# Patient Record
Sex: Male | Born: 1935 | Race: Black or African American | Hispanic: No | Marital: Married | Smoking: Former smoker
Health system: Southern US, Community
[De-identification: ages and names within clinical notes are randomized; demographics above are authoritative.]

## PROBLEM LIST (undated history)

## (undated) DIAGNOSIS — K219 Gastro-esophageal reflux disease without esophagitis: Secondary | ICD-10-CM

## (undated) DIAGNOSIS — R319 Hematuria, unspecified: Secondary | ICD-10-CM

## (undated) DIAGNOSIS — I10 Essential (primary) hypertension: Secondary | ICD-10-CM

## (undated) DIAGNOSIS — M1990 Unspecified osteoarthritis, unspecified site: Secondary | ICD-10-CM

## (undated) DIAGNOSIS — R011 Cardiac murmur, unspecified: Secondary | ICD-10-CM

---

## 2015-06-09 DIAGNOSIS — R319 Hematuria, unspecified: Secondary | ICD-10-CM

## 2015-06-09 HISTORY — PX: CYSTOSCOPY: SUR368

## 2015-06-09 HISTORY — DX: Hematuria, unspecified: R31.9

## 2015-07-02 ENCOUNTER — Emergency Department (HOSPITAL_COMMUNITY)
Admission: EM | Admit: 2015-07-02 | Discharge: 2015-07-02 | Disposition: A | Discharge status: Home / Self Care | Payer: Medicare Other | Attending: Emergency Medicine | Admitting: Emergency Medicine

## 2015-07-02 DIAGNOSIS — J0100 Acute maxillary sinusitis, unspecified: Secondary | ICD-10-CM | POA: Diagnosis not present

## 2015-07-02 DIAGNOSIS — R0981 Nasal congestion: Secondary | ICD-10-CM | POA: Diagnosis present

## 2015-07-02 NOTE — ED Provider Notes (Signed)
CSN: [id number]     Arrival date & time 07/02/15  1524 History   None    Chief Complaint  Patient presents with  . Nasal Congestion     (Consider location/radiation/quality/duration/timing/severity/associated sxs/prior Treatment) HPI Comments: Patient presents for evaluation of worsening sinus congestion and throat congestion. Patient has been sick for nearly a week. Patient was seen at an urgent care and started on amoxicillin, but did not improve after 2 days. He saw his doctor who stopped the amoxicillin and started him on Mucinex. Patient still not improving. There is no shortness of breath. He has not had any fever. Patient reports persistent sinus congestion, ear popping, facial discomfort.    No past medical history on file. No past surgical history on file. No family history on file. Social History  Substance Use Topics  . Smoking status: Not on file  . Smokeless tobacco: Not on file  . Alcohol Use: Not on file    Review of Systems  Constitutional: Negative for fever.  HENT: Positive for congestion.   All other systems reviewed and are negative.     Allergies  Review of patient's allergies indicates not on file.  Home Medications   Prior to Admission medications   Not on File   There were no vitals taken for this visit. Physical Exam  Constitutional: He is oriented to person, place, and time. He appears well-developed and well-nourished. No distress.  HENT:  Head: Normocephalic and atraumatic.  Right Ear: Hearing normal.  Left Ear: Hearing normal.  Nose: Mucosal edema present. Right sinus exhibits maxillary sinus tenderness. Left sinus exhibits maxillary sinus tenderness.  Mouth/Throat: Oropharynx is clear and moist and mucous membranes are normal.  Eyes: Conjunctivae and EOM are normal. Pupils are equal, round, and reactive to light.  Neck: Normal range of motion. Neck supple.  Cardiovascular: Regular rhythm, S1 normal and S2 normal.  Exam reveals no gallop  and no friction rub.   No murmur heard. Pulmonary/Chest: Effort normal and breath sounds normal. No respiratory distress. He exhibits no tenderness.  Abdominal: Soft. Normal appearance and bowel sounds are normal. There is no hepatosplenomegaly. There is no tenderness. There is no rebound, no guarding, no tenderness at McBurney's point and negative Murphy's sign. No hernia.  Musculoskeletal: Normal range of motion.  Neurological: He is alert and oriented to person, place, and time. He has normal strength. No cranial nerve deficit or sensory deficit. Coordination normal. GCS eye subscore is 4. GCS verbal subscore is 5. GCS motor subscore is 6.  Skin: Skin is warm, dry and intact. No rash noted. No cyanosis.  Psychiatric: He has a normal mood and affect. His speech is normal and behavior is normal. Thought content normal.  Nursing note and vitals reviewed.   ED Course  Procedures (including critical care time) Labs Review Labs Reviewed - No data to display  Imaging Review No results found. I have personally reviewed and evaluated these images and lab results as part of my medical decision-making.   EKG Interpretation None      MDM   Final diagnoses:  Acute maxillary sinusitis, recurrence not specified    Patient seen and examined for sinus congestion and URI type symptoms. Symptoms ongoing for nearly a week. Patient has multiple comorbidities. Patient does have signs and symptoms of sinusitis, will treat with Biaxin and prednisone.    Gilda Crease, MD 07/02/15 [phone]

## 2015-07-02 NOTE — ED Notes (Signed)
SEE DOWNTIME FORMS 

## 2015-07-02 NOTE — ED Notes (Signed)
Pt reports sinus congestion for extended amount of time, has been to pcp and given meds but no relief. Reports having nasal congestion and feels mucus running down the back of his throat. Airway intact and no distress noted.

## 2015-07-07 ENCOUNTER — Inpatient Hospital Stay (HOSPITAL_COMMUNITY)
Admission: EM | Admit: 2015-07-07 | Discharge: 2015-07-10 | DRG: 644 | Disposition: A | Discharge status: Home / Self Care | Payer: Medicare Other | Attending: Internal Medicine | Admitting: Internal Medicine

## 2015-07-07 ENCOUNTER — Emergency Department (HOSPITAL_COMMUNITY): Payer: Medicare Other

## 2015-07-07 DIAGNOSIS — K219 Gastro-esophageal reflux disease without esophagitis: Secondary | ICD-10-CM | POA: Diagnosis present

## 2015-07-07 DIAGNOSIS — B9689 Other specified bacterial agents as the cause of diseases classified elsewhere: Secondary | ICD-10-CM | POA: Diagnosis not present

## 2015-07-07 DIAGNOSIS — E222 Syndrome of inappropriate secretion of antidiuretic hormone: Secondary | ICD-10-CM | POA: Diagnosis present

## 2015-07-07 DIAGNOSIS — K121 Other forms of stomatitis: Secondary | ICD-10-CM | POA: Diagnosis present

## 2015-07-07 DIAGNOSIS — N342 Other urethritis: Secondary | ICD-10-CM

## 2015-07-07 DIAGNOSIS — N390 Urinary tract infection, site not specified: Secondary | ICD-10-CM | POA: Diagnosis not present

## 2015-07-07 DIAGNOSIS — E871 Hypo-osmolality and hyponatremia: Secondary | ICD-10-CM | POA: Diagnosis present

## 2015-07-07 DIAGNOSIS — N179 Acute kidney failure, unspecified: Secondary | ICD-10-CM

## 2015-07-07 DIAGNOSIS — E785 Hyperlipidemia, unspecified: Secondary | ICD-10-CM | POA: Diagnosis present

## 2015-07-07 DIAGNOSIS — I10 Essential (primary) hypertension: Secondary | ICD-10-CM | POA: Diagnosis present

## 2015-07-07 DIAGNOSIS — Z7982 Long term (current) use of aspirin: Secondary | ICD-10-CM | POA: Diagnosis not present

## 2015-07-07 DIAGNOSIS — Z9889 Other specified postprocedural states: Secondary | ICD-10-CM | POA: Diagnosis not present

## 2015-07-07 DIAGNOSIS — R269 Unspecified abnormalities of gait and mobility: Secondary | ICD-10-CM

## 2015-07-07 HISTORY — DX: Gastro-esophageal reflux disease without esophagitis: K21.9

## 2015-07-07 HISTORY — DX: Essential (primary) hypertension: I10

## 2015-07-07 HISTORY — DX: Cardiac murmur, unspecified: R01.1

## 2015-07-07 HISTORY — DX: Unspecified osteoarthritis, unspecified site: M19.90

## 2015-07-07 HISTORY — DX: Hematuria, unspecified: R31.9

## 2015-07-07 LAB — URINALYSIS, ROUTINE W REFLEX MICROSCOPIC
BILIRUBIN URINE: NEGATIVE
GLUCOSE, UA: NEGATIVE mg/dL
Ketones, ur: NEGATIVE mg/dL
Nitrite: POSITIVE — AB
PROTEIN: NEGATIVE mg/dL
SPECIFIC GRAVITY, URINE: 1.013 (ref 1.005–1.030)
UROBILINOGEN UA: 0.2 mg/dL (ref 0.0–1.0)
pH: 6 (ref 5.0–8.0)

## 2015-07-07 LAB — CBC WITH DIFFERENTIAL/PLATELET
Basophils Absolute: 0 10*3/uL (ref 0.0–0.1)
Basophils Relative: 0 % (ref 0–1)
EOS ABS: 0 10*3/uL (ref 0.0–0.7)
EOS PCT: 0 % (ref 0–5)
HCT: 42.3 % (ref 39.0–52.0)
Hemoglobin: 15.6 g/dL (ref 13.0–17.0)
LYMPHS ABS: 0.4 10*3/uL — AB (ref 0.7–4.0)
LYMPHS PCT: 3 % — AB (ref 12–46)
MCH: 31.5 pg (ref 26.0–34.0)
MCHC: 36.9 g/dL — ABNORMAL HIGH (ref 30.0–36.0)
MCV: 85.5 fL (ref 78.0–100.0)
MONO ABS: 1 10*3/uL (ref 0.1–1.0)
Monocytes Relative: 7 % (ref 3–12)
Neutro Abs: 12.5 10*3/uL — ABNORMAL HIGH (ref 1.7–7.7)
Neutrophils Relative %: 90 % — ABNORMAL HIGH (ref 43–77)
PLATELETS: 317 10*3/uL (ref 150–400)
RBC: 4.95 MIL/uL (ref 4.22–5.81)
RDW: 12.4 % (ref 11.5–15.5)
WBC: 12.6 10*3/uL — AB (ref 4.0–10.5)

## 2015-07-07 LAB — TROPONIN I

## 2015-07-07 LAB — BASIC METABOLIC PANEL
ANION GAP: 9 (ref 5–15)
Anion gap: 13 (ref 5–15)
BUN: 15 mg/dL (ref 6–20)
BUN: 18 mg/dL (ref 6–20)
CALCIUM: 9.4 mg/dL (ref 8.9–10.3)
CALCIUM: 8.5 mg/dL — AB (ref 8.9–10.3)
CO2: 26 mmol/L (ref 22–32)
CO2: 26 mmol/L (ref 22–32)
CREATININE: 1.17 mg/dL (ref 0.61–1.24)
CREATININE: 1.23 mg/dL (ref 0.61–1.24)
Chloride: 79 mmol/L — ABNORMAL LOW (ref 101–111)
Chloride: 83 mmol/L — ABNORMAL LOW (ref 101–111)
GFR calc Af Amer: >60 mL/min (ref >60)
GFR calc Af Amer: >60 mL/min (ref >60)
GFR, EST NON AFRICAN AMERICAN: 58 mL/min — AB (ref >60)
GFR, EST NON AFRICAN AMERICAN: 55 mL/min — AB (ref >60)
GLUCOSE: 95 mg/dL (ref 65–99)
Glucose, Bld: 123 mg/dL — ABNORMAL HIGH (ref 65–99)
Potassium: 3.5 mmol/L (ref 3.5–5.1)
Potassium: 3.8 mmol/L (ref 3.5–5.1)
Sodium: 118 mmol/L — CL (ref 135–145)
Sodium: 118 mmol/L — CL (ref 135–145)

## 2015-07-07 LAB — COMPREHENSIVE METABOLIC PANEL
ALT: 38 U/L (ref 17–63)
ANION GAP: 12 (ref 5–15)
AST: 40 U/L (ref 15–41)
Albumin: 3.7 g/dL (ref 3.5–5.0)
Alkaline Phosphatase: 61 U/L (ref 38–126)
BUN: 19 mg/dL (ref 6–20)
CHLORIDE: 80 mmol/L — AB (ref 101–111)
CO2: 24 mmol/L (ref 22–32)
Calcium: 9.2 mg/dL (ref 8.9–10.3)
Creatinine, Ser: 1.26 mg/dL — ABNORMAL HIGH (ref 0.61–1.24)
GFR, EST NON AFRICAN AMERICAN: 53 mL/min — AB (ref >60)
Glucose, Bld: 109 mg/dL — ABNORMAL HIGH (ref 65–99)
POTASSIUM: 3.5 mmol/L (ref 3.5–5.1)
SODIUM: 116 mmol/L — AB (ref 135–145)
Total Bilirubin: 1 mg/dL (ref 0.3–1.2)
Total Protein: 7.2 g/dL (ref 6.5–8.1)

## 2015-07-07 LAB — BRAIN NATRIURETIC PEPTIDE: B NATRIURETIC PEPTIDE 5: 33.6 pg/mL (ref 0.0–100.0)

## 2015-07-07 LAB — TSH: TSH: 0.609 u[IU]/mL (ref 0.350–4.500)

## 2015-07-07 LAB — URINE MICROSCOPIC-ADD ON

## 2015-07-07 LAB — SODIUM, URINE, RANDOM
Sodium, Ur: 35 mmol/L
Sodium, Ur: 38 mmol/L

## 2015-07-07 LAB — OSMOLALITY, URINE: Osmolality, Ur: 246 mOsm/kg — ABNORMAL LOW (ref 390–1090)

## 2015-07-07 MED ORDER — DEXTROSE 5 % IV SOLN
1.0000 g | INTRAVENOUS | Status: DC
  Administered 2015-07-08 – 2015-07-09 (×2): 1 g via INTRAVENOUS
  Filled 2015-07-07 (×2): qty 10

## 2015-07-07 MED ORDER — SODIUM CHLORIDE 0.9 % IV SOLN
Freq: Once | INTRAVENOUS | Status: AC
  Administered 2015-07-07: 12:00:00 via INTRAVENOUS

## 2015-07-07 MED ORDER — CEFTRIAXONE SODIUM 1 G IJ SOLR
1.0000 g | Freq: Once | INTRAMUSCULAR | Status: AC
  Administered 2015-07-07: 1 g via INTRAVENOUS
  Filled 2015-07-07: qty 10

## 2015-07-07 MED ORDER — SODIUM CHLORIDE 0.9 % IJ SOLN
3.0000 mL | Freq: Two times a day (BID) | INTRAMUSCULAR | Status: DC
  Administered 2015-07-08 – 2015-07-09 (×2): 3 mL via INTRAVENOUS

## 2015-07-07 MED ORDER — ASPIRIN 81 MG PO CHEW
81.0000 mg | CHEWABLE_TABLET | Freq: Every day | ORAL | Status: DC
  Administered 2015-07-08 – 2015-07-10 (×3): 81 mg via ORAL
  Filled 2015-07-07 (×3): qty 1

## 2015-07-07 MED ORDER — FLUTICASONE PROPIONATE 50 MCG/ACT NA SUSP
2.0000 | Freq: Every day | NASAL | Status: DC
  Administered 2015-07-08 – 2015-07-10 (×4): 2 via NASAL
  Filled 2015-07-07: qty 16

## 2015-07-07 MED ORDER — SODIUM CHLORIDE 0.9 % IJ SOLN
3.0000 mL | Freq: Two times a day (BID) | INTRAMUSCULAR | Status: DC
  Administered 2015-07-08 – 2015-07-09 (×3): 3 mL via INTRAVENOUS

## 2015-07-07 MED ORDER — ENOXAPARIN SODIUM 40 MG/0.4ML ~~LOC~~ SOLN
40.0000 mg | SUBCUTANEOUS | Status: DC
Start: 2015-07-07 — End: 2015-07-10
  Administered 2015-07-07 – 2015-07-09 (×3): 40 mg via SUBCUTANEOUS
  Filled 2015-07-07 (×3): qty 0.4

## 2015-07-07 MED ORDER — FAMOTIDINE 20 MG PO TABS
20.0000 mg | ORAL_TABLET | Freq: Two times a day (BID) | ORAL | Status: DC
  Administered 2015-07-07 – 2015-07-08 (×2): 20 mg via ORAL
  Filled 2015-07-07 (×2): qty 1

## 2015-07-07 MED ORDER — AMLODIPINE BESYLATE 10 MG PO TABS
10.0000 mg | ORAL_TABLET | Freq: Every day | ORAL | Status: DC
  Administered 2015-07-08 – 2015-07-10 (×3): 10 mg via ORAL
  Filled 2015-07-07 (×3): qty 1

## 2015-07-07 MED ORDER — SODIUM CHLORIDE 0.9 % IV SOLN
INTRAVENOUS | Status: DC
  Administered 2015-07-07: 1000 mL via INTRAVENOUS
  Administered 2015-07-08: 05:00:00 via INTRAVENOUS

## 2015-07-07 MED ORDER — NYSTATIN 100000 UNIT/ML MT SUSP
5.0000 mL | Freq: Four times a day (QID) | OROMUCOSAL | Status: DC
  Administered 2015-07-08 – 2015-07-10 (×10): 500000 [IU] via OROMUCOSAL
  Filled 2015-07-07 (×10): qty 5

## 2015-07-07 MED ORDER — PRAVASTATIN SODIUM 40 MG PO TABS
40.0000 mg | ORAL_TABLET | Freq: Every day | ORAL | Status: DC
  Administered 2015-07-08 – 2015-07-09 (×3): 40 mg via ORAL
  Filled 2015-07-07 (×3): qty 1

## 2015-07-07 NOTE — ED Notes (Signed)
Patient C/O more weakness since Diagnosed with UTI.  Worsened yesterday. To [facility] ED for evaluation.

## 2015-07-07 NOTE — ED Provider Notes (Signed)
CSN: [id number]     Arrival date & time 07/07/15  0920 History   First MD Initiated Contact with Patient 07/07/15 0920     Chief Complaint  Patient presents with  . Weakness     (Consider location/radiation/quality/duration/timing/severity/associated sxs/prior Treatment) Patient is a 78 y.o. male presenting with weakness. The history is provided by the patient and the spouse.  Weakness This is a new problem. The current episode started 2 days ago. The problem occurs constantly. The problem has been gradually worsening. Associated symptoms include shortness of breath (associated with hiccup). Pertinent negatives include no chest pain and no abdominal pain. Nothing aggravates the symptoms. Nothing relieves the symptoms. He has tried nothing for the symptoms. The treatment provided mild relief.    No past medical history on file. No past surgical history on file. No family history on file. Social History  Substance Use Topics  . Smoking status: Not on file  . Smokeless tobacco: Not on file  . Alcohol Use: Not on file    Review of Systems  Constitutional: Negative for fever and chills.  HENT: Negative for congestion.   Eyes: Negative for itching.  Respiratory: Positive for cough and shortness of breath (associated with hiccup).   Cardiovascular: Negative for chest pain.  Gastrointestinal: Negative for nausea, vomiting and abdominal pain.  Genitourinary: Negative for dysuria and enuresis.  Skin: Negative for rash and wound.  Neurological: Positive for weakness.  All other systems reviewed and are negative.     Allergies  Review of patient's allergies indicates not on file.  Home Medications   Prior to Admission medications   Not on File   BP 125/62 mmHg  Pulse 83  Temp(Src) 98.8 F (37.1 C) (Oral)  Resp 18  SpO2 98% Physical Exam  Constitutional: He is oriented to person, place, and time. He appears well-developed and well-nourished.  HENT:  Head: Normocephalic and  atraumatic.  Mouth/Throat: Posterior oropharyngeal erythema (with multiple white lesions) present.  Eyes: Conjunctivae and EOM are normal.  Neck: Normal range of motion. Neck supple.  Cardiovascular: Normal rate and regular rhythm.   Murmur heard. Pulmonary/Chest: Effort normal. No respiratory distress.  Abdominal: Soft. There is no tenderness.  Musculoskeletal: Normal range of motion. He exhibits edema (BLE). He exhibits no tenderness.  Neurological: He is alert and oriented to person, place, and time.  No altered mental status, but not able to give full seemingly accurate history.  Face is symmetric, EOM's intact, pupils equal and reactive, vision intact, tongue and uvula midline without deviation Upper and Lower extremity motor 5/5, intact pain perception in distal extremities, 2+ reflexes in biceps, patella and achilles tendons. Finger to nose slowed but normal.  Has pill rolling actions in right hand.   Skin: Skin is warm and dry.  Nursing note and vitals reviewed.   ED Course  Procedures (including critical care time)  CRITICAL CARE Performed by: Marily Memos   Total critical care time: 35 minutes  Critical care time was exclusive of separately billable procedures and treating other patients.  Critical care was necessary to treat or prevent imminent or life-threatening deterioration.  Critical care was time spent personally by me on the following activities: development of treatment plan with patient and/or surrogate as well as nursing, discussions with consultants, evaluation of patient's response to treatment, examination of patient, obtaining history from patient or surrogate, ordering and performing treatments and interventions, ordering and review of laboratory studies, ordering and review of radiographic studies, pulse oximetry and re-evaluation of  patient's condition.   Labs Review Labs Reviewed  URINALYSIS, ROUTINE W REFLEX MICROSCOPIC (NOT AT [facility]) - Abnormal;  Notable for the following:    APPearance CLOUDY (*)    Hgb urine dipstick SMALL (*)    Nitrite POSITIVE (*)    Leukocytes, UA MODERATE (*)    All other components within normal limits  CBC WITH DIFFERENTIAL/PLATELET - Abnormal; Notable for the following:    WBC 12.6 (*)    MCHC 36.9 (*)    Neutrophils Relative % 90 (*)    Neutro Abs 12.5 (*)    Lymphocytes Relative 3 (*)    Lymphs Abs 0.4 (*)    All other components within normal limits  COMPREHENSIVE METABOLIC PANEL - Abnormal; Notable for the following:    Sodium 116 (*)    Chloride 80 (*)    Glucose, Bld 109 (*)    Creatinine, Ser 1.26 (*)    GFR calc non Af Amer 53 (*)    All other components within normal limits  URINE MICROSCOPIC-ADD ON - Abnormal; Notable for the following:    Bacteria, UA MANY (*)    All other components within normal limits  OSMOLALITY - Abnormal; Notable for the following:    Osmolality 248 (*)    All other components within normal limits  OSMOLALITY, URINE - Abnormal; Notable for the following:    Osmolality, Ur 246 (*)    All other components within normal limits  OSMOLALITY, URINE - Abnormal; Notable for the following:    Osmolality, Ur 207 (*)    All other components within normal limits  BASIC METABOLIC PANEL - Abnormal; Notable for the following:    Sodium 118 (*)    Chloride 79 (*)    GFR calc non Af Amer 58 (*)    All other components within normal limits  BASIC METABOLIC PANEL - Abnormal; Notable for the following:    Sodium 118 (*)    Chloride 83 (*)    Glucose, Bld 123 (*)    Calcium 8.5 (*)    GFR calc non Af Amer 55 (*)    All other components within normal limits  BASIC METABOLIC PANEL - Abnormal; Notable for the following:    Sodium 118 (*)    Chloride 81 (*)    Calcium 8.7 (*)    All other components within normal limits  BASIC METABOLIC PANEL - Abnormal; Notable for the following:    Sodium 120 (*)    Potassium 3.4 (*)    Chloride 83 (*)    Calcium 8.8 (*)    All other  components within normal limits  CBC - Abnormal; Notable for the following:    MCHC 36.1 (*)    All other components within normal limits  URINE CULTURE  TROPONIN I  BRAIN NATRIURETIC PEPTIDE  SODIUM, URINE, RANDOM  TSH  SODIUM, URINE, RANDOM  CORTISOL-AM, BLOOD  BASIC METABOLIC PANEL  BASIC METABOLIC PANEL    Imaging Review Dg Chest 2 View  07/07/2015   CLINICAL DATA:  Shortness of breath.  Pneumonia.  Initial encounter.  EXAM: CHEST  2 VIEW  COMPARISON:  None.  FINDINGS: The lungs are clear. Heart size is mildly enlarged. No pneumothorax or pleural effusion. No focal bony abnormality.  IMPRESSION: No acute disease.   Electronically Signed   By: Drusilla Kanner M.D.   On: 07/07/2015 10:31   I have personally reviewed and evaluated these images and lab results as part of my medical decision-making.  EKG Interpretation   Date/Time:  Monday July 07 2015 10:13:02 EDT Ventricular Rate:  83 PR Interval:  214 QRS Duration: 119 QT Interval:  381 QTC Calculation: 448 R Axis:   -69 Text Interpretation:  Sinus rhythm Borderline prolonged PR interval Left  anterior fascicular block Probable left ventricular hypertrophy ST  elevation, consider anterolateral injury Baseline wander in lead(s) V4 V5  V6 Poor tracing.  Confirmed by [facility] MD, Barbara Cower [phone]) on 07/07/2015  10:48:26 AM      MDM   Final diagnoses:  Hyponatremia  Infective urethritis  AKI (acute kidney injury)   Patient here secondary to 2 weeks' worth worsening dysuria or polyuria and in 2 days worsening confusion and weakness. Neuro exam ok aside from confusion. Found to have UTI which was treated with Rocephin and also found to have hyponatremia which I will treat with a normal saline infusion. Symptoms slightly improved with normal saline, however will likely need prolonged repletion, will admit to medicine for further evaluation and management.      Marily Memos, MD 07/08/15 [phone]

## 2015-07-07 NOTE — ED Notes (Signed)
Patient C/O having poor balance, dizziness and clicking in his ears.

## 2015-07-07 NOTE — ED Notes (Signed)
Called 3E to give report.  Receiving RN unable to take report. 

## 2015-07-07 NOTE — H&P (Signed)
Date: 07/07/2015               Patient Name:  Glenn Harrell MRN: [id number]  DOB: [birth year] Age / Sex: 79 y.o., male   PCP: Dr. Knox Royalty, MD         Medical Service: Internal Medicine Teaching Service         Attending Physician: Dr. Burns Spain, MD    First Contact: Dr. Ruben Im Pager: [phone]  Second Contact: Dr. Evelena Peat Pager: [phone]       After Hours (After 5p/  First Contact Pager: [phone]  weekends / holidays): Second Contact Pager: [phone]   Chief Complaint: Weakness  History of Present Illness: Glenn Harrell is a 79 yo man with a PMH of HTN and HLD who presents with worsening weakness. He says that he feels weak all over and it has been worsening. Moreover, he's been experience imbalance, dizziness, and ear popping. He says these symptoms started after went to the ED last week for sinusitis and have been worsening. For that ED visit, he was given a 20 day course of clarithromycin and a 1 week course of prednisone. He also endorses post-nasal drip that is associated with some "acid reflux." Because of this reflux he's been drinking approximately 32 oz of water daily. He denies any recent head injuries, falls, headache, fever, nausea, vomiting, diarrhea. He denies any strenuous work outside. He does not smoke, drink, or use drugs.  Of note, he was noted to have hematuria in the past and a cystoscopy was performed 2 weeks ago. No source of bleeding was identified and it appears he was not given antibiotic prophylaxis. He has endorsed a degree of pain on urination over the past week.  In the ED, he was noted to have a Na to 116. A UA showed +nitrites,+leuks and he was given IV ceftriaxone.   Meds: Current Facility-Administered Medications  Medication Dose Route Frequency Provider Last Rate Last Dose  . 0.9 %  sodium chloride infusion   Intravenous Continuous Yolanda Manges, DO      . [START ON 07/08/2015] amLODipine (NORVASC) tablet 10 mg  10 mg Oral Daily Yolanda Manges, DO      . [START ON 07/08/2015] aspirin chewable tablet 81 mg  81 mg Oral Daily Yolanda Manges, DO      . [START ON 07/08/2015] cefTRIAXone (ROCEPHIN) 1 g in dextrose 5 % 50 mL IVPB  1 g Intravenous Q24H Darl Householder Masters, RPH      . enoxaparin (LOVENOX) injection 40 mg  40 mg Subcutaneous Q24H Yolanda Manges, DO      . nystatin (MYCOSTATIN) 100000 UNIT/ML suspension 500,000 Units  5 mL Mouth/Throat QID Ruben Im, MD      . pravastatin (PRAVACHOL) tablet 40 mg  40 mg Oral QHS Yolanda Manges, DO      . sodium chloride 0.9 % injection 3 mL  3 mL Intravenous Q12H Yolanda Manges, DO      . sodium chloride 0.9 % injection 3 mL  3 mL Intravenous Q12H Yolanda Manges, DO       Review of Systems: Negative except per above  Physical Exam: Blood pressure 104/66, pulse 83, temperature 98.3 F (36.8 C), temperature source Oral, resp. rate 18, height 6\' 2"  (1.88 m), weight 210 lb 3.2 oz (95.346 kg), SpO2 97 %.  General:  Lying in bed, no acute distress HEENT:  White spots on tonsils and tongue with thrush.  Some tonsillar erythema. EOMI, PERRL.  Cardiac: RRR without murmurs. Normal S1, S2 Pulmonary:  Clear to auscultation bilaterally without wheezes, crackles, or rhonchi Ext: 1+ edema to mid-shin. No cyanosis or clubbing Neuro:  Alert and oriented x3. 5/5 strength in all extremities.  Lab results: Basic Metabolic Panel:  Recent Labs  29/56/21 1046  NA 116*  K 3.5  CL 80*  CO2 24  GLUCOSE 109*  BUN 19  CREATININE 1.26*  CALCIUM 9.2   Liver Function Tests:  Recent Labs  07/07/15 1046  AST 40  ALT 38  ALKPHOS 61  BILITOT 1.0  PROT 7.2  ALBUMIN 3.7   CBC:  Recent Labs  07/07/15 1046  WBC 12.6*  NEUTROABS 12.5*  HGB 15.6  HCT 42.3  MCV 85.5  PLT 317   Cardiac Enzymes:  Recent Labs  07/07/15 1046  TROPONINI <0.03   Urinalysis:  Recent Labs  07/07/15 1012  COLORURINE YELLOW  LABSPEC 1.013  PHURINE 6.0  GLUCOSEU NEGATIVE  HGBUR SMALL*  BILIRUBINUR NEGATIVE    KETONESUR NEGATIVE  PROTEINUR NEGATIVE  UROBILINOGEN 0.2  NITRITE POSITIVE*  LEUKOCYTESUR MODERATE*    Imaging results:  Dg Chest 2 View  07/07/2015   CLINICAL DATA:  Shortness of breath.  Pneumonia.  Initial encounter.  EXAM: CHEST  2 VIEW  COMPARISON:  None.  FINDINGS: The lungs are clear. Heart size is mildly enlarged. No pneumothorax or pleural effusion. No focal bony abnormality.  IMPRESSION: No acute disease.   Electronically Signed   By: Drusilla Kanner M.D.   On: 07/07/2015 10:31    Assessment & Plan by Problem:  Hyponatremia: Overall euvolemic save for 1+ edema to midshins. Unclear etiology at this point. Could be related to triamterene-HCTZ, although unlikely to cause hyponatremia to 116 in isolation. While he has been drinking more water recently, up to 32 oz, this is not an excessive amount. 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 To correct for no more that 6 mEq/day, he must receive <320 ml/hr NS. - Serum osmolality, urina osmolality, urine sodium pending - Maximum NS infusion for safe correction is 350 ml/hr; for safety will infuse NS at 100 ml/hr - BMP q4h, Na correction should not exceed 0.25 mEq/hr - TSH, morning cortisol pending - Fluid restrict to <800 ml/day  Urinary Tract Infection: Likely due to recent cystoscopy. - Ceftriaxone 1g IV BID  Denture-Associated Stomatitis:  Nystatin mouth wash  Sinusitis: Toward the end of prednisone regimen, but about a week of chloramphenicol left. Will hold both. - Ceftriaxone per above - Fluticasone spray daily  GERD: Likely due to post-nasal drip - famotidine 20 mg BID  HTN: Continue home amlodipine 10 mg daily  HLD: Continue home pravastatin 40 mg daily, ASA 81 mg daily  DVT Prophylaxis: Lovenox  Dispo: Disposition is deferred at this time, awaiting improvement of current medical problems. Anticipated discharge in approximately 1-2 day(s).   The patient does have a current PCP, Dr. Knox Royalty, MD, and does not need an  [facility] hospital follow-up appointment after discharge.  The patient does not have transportation limitations that hinder transportation to clinic appointments.  Signed: Ruben Im, MD 07/07/2015, 6:21 PM

## 2015-07-08 DIAGNOSIS — J329 Chronic sinusitis, unspecified: Secondary | ICD-10-CM

## 2015-07-08 DIAGNOSIS — K219 Gastro-esophageal reflux disease without esophagitis: Secondary | ICD-10-CM

## 2015-07-08 DIAGNOSIS — B370 Candidal stomatitis: Secondary | ICD-10-CM

## 2015-07-08 DIAGNOSIS — B9689 Other specified bacterial agents as the cause of diseases classified elsewhere: Secondary | ICD-10-CM

## 2015-07-08 DIAGNOSIS — N390 Urinary tract infection, site not specified: Secondary | ICD-10-CM

## 2015-07-08 DIAGNOSIS — I10 Essential (primary) hypertension: Secondary | ICD-10-CM

## 2015-07-08 DIAGNOSIS — E871 Hypo-osmolality and hyponatremia: Secondary | ICD-10-CM

## 2015-07-08 DIAGNOSIS — Z9889 Other specified postprocedural states: Secondary | ICD-10-CM

## 2015-07-08 DIAGNOSIS — E785 Hyperlipidemia, unspecified: Secondary | ICD-10-CM

## 2015-07-08 LAB — BASIC METABOLIC PANEL
ANION GAP: 14 (ref 5–15)
ANION GAP: 11 (ref 5–15)
ANION GAP: 13 (ref 5–15)
Anion gap: 11 (ref 5–15)
Anion gap: 10 (ref 5–15)
BUN: 18 mg/dL (ref 6–20)
BUN: 17 mg/dL (ref 6–20)
BUN: 16 mg/dL (ref 6–20)
BUN: 19 mg/dL (ref 6–20)
BUN: 22 mg/dL — ABNORMAL HIGH (ref 6–20)
CALCIUM: 8.8 mg/dL — AB (ref 8.9–10.3)
CALCIUM: 9.2 mg/dL (ref 8.9–10.3)
CALCIUM: 9 mg/dL (ref 8.9–10.3)
CALCIUM: 8.8 mg/dL — AB (ref 8.9–10.3)
CO2: 24 mmol/L (ref 22–32)
CO2: 26 mmol/L (ref 22–32)
CO2: 26 mmol/L (ref 22–32)
CO2: 26 mmol/L (ref 22–32)
CO2: 17 mmol/L — ABNORMAL LOW (ref 22–32)
Calcium: 8.7 mg/dL — ABNORMAL LOW (ref 8.9–10.3)
Chloride: 81 mmol/L — ABNORMAL LOW (ref 101–111)
Chloride: 83 mmol/L — ABNORMAL LOW (ref 101–111)
Chloride: 86 mmol/L — ABNORMAL LOW (ref 101–111)
Chloride: 88 mmol/L — ABNORMAL LOW (ref 101–111)
Chloride: 91 mmol/L — ABNORMAL LOW (ref 101–111)
Creatinine, Ser: 1.11 mg/dL (ref 0.61–1.24)
Creatinine, Ser: 1.14 mg/dL (ref 0.61–1.24)
Creatinine, Ser: 1.14 mg/dL (ref 0.61–1.24)
Creatinine, Ser: 1.31 mg/dL — ABNORMAL HIGH (ref 0.61–1.24)
Creatinine, Ser: 1.2 mg/dL (ref 0.61–1.24)
GFR calc Af Amer: 59 mL/min — ABNORMAL LOW (ref >60)
GFR calc Af Amer: >60 mL/min (ref >60)
GFR calc non Af Amer: >60 mL/min (ref >60)
GFR calc non Af Amer: >60 mL/min (ref >60)
GFR, EST NON AFRICAN AMERICAN: 51 mL/min — AB (ref >60)
GFR, EST NON AFRICAN AMERICAN: 57 mL/min — AB (ref >60)
GLUCOSE: 87 mg/dL (ref 65–99)
GLUCOSE: 89 mg/dL (ref 65–99)
Glucose, Bld: 101 mg/dL — ABNORMAL HIGH (ref 65–99)
Glucose, Bld: 121 mg/dL — ABNORMAL HIGH (ref 65–99)
Glucose, Bld: 135 mg/dL — ABNORMAL HIGH (ref 65–99)
POTASSIUM: 3.4 mmol/L — AB (ref 3.5–5.1)
POTASSIUM: 3.5 mmol/L (ref 3.5–5.1)
POTASSIUM: 3.6 mmol/L (ref 3.5–5.1)
POTASSIUM: 3.8 mmol/L (ref 3.5–5.1)
Potassium: 4.4 mmol/L (ref 3.5–5.1)
SODIUM: 120 mmol/L — AB (ref 135–145)
Sodium: 118 mmol/L — CL (ref 135–145)
Sodium: 123 mmol/L — ABNORMAL LOW (ref 135–145)
Sodium: 124 mmol/L — ABNORMAL LOW (ref 135–145)
Sodium: 122 mmol/L — ABNORMAL LOW (ref 135–145)

## 2015-07-08 LAB — OSMOLALITY: Osmolality: 248 mOsm/kg — ABNORMAL LOW (ref 275–300)

## 2015-07-08 LAB — CBC
HEMATOCRIT: 42.4 % (ref 39.0–52.0)
HEMOGLOBIN: 15.3 g/dL (ref 13.0–17.0)
MCH: 31.1 pg (ref 26.0–34.0)
MCHC: 36.1 g/dL — ABNORMAL HIGH (ref 30.0–36.0)
MCV: 86.2 fL (ref 78.0–100.0)
Platelets: 341 10*3/uL (ref 150–400)
RBC: 4.92 MIL/uL (ref 4.22–5.81)
RDW: 12.3 % (ref 11.5–15.5)
WBC: 10 10*3/uL (ref 4.0–10.5)

## 2015-07-08 LAB — CORTISOL-AM, BLOOD: Cortisol - AM: 15.3 ug/dL (ref 6.7–22.6)

## 2015-07-08 LAB — OSMOLALITY, URINE: OSMOLALITY UR: 207 mosm/kg — AB (ref 390–1090)

## 2015-07-08 MED ORDER — POTASSIUM CHLORIDE CRYS ER 20 MEQ PO TBCR
40.0000 meq | EXTENDED_RELEASE_TABLET | Freq: Once | ORAL | Status: AC
  Administered 2015-07-08: 40 meq via ORAL
  Filled 2015-07-08: qty 2

## 2015-07-08 MED ORDER — CALCIUM CARBONATE ANTACID 500 MG PO CHEW
2.0000 | CHEWABLE_TABLET | Freq: Once | ORAL | Status: AC
  Administered 2015-07-09: 400 mg via ORAL
  Filled 2015-07-08: qty 2

## 2015-07-08 MED ORDER — FAMOTIDINE 20 MG PO TABS
40.0000 mg | ORAL_TABLET | Freq: Two times a day (BID) | ORAL | Status: DC
  Administered 2015-07-08 – 2015-07-09 (×2): 40 mg via ORAL
  Filled 2015-07-08 (×2): qty 2

## 2015-07-08 NOTE — Evaluation (Addendum)
Physical Therapy Evaluation Patient Details Name: Glenn Harrell MRN: [id number] DOB: 03-13-1936 Today's Date: 07/08/2015   History of Present Illness  Glenn Harrell is a 79 yo man with a PMH of HTN and HLD who presents with worsening weakness. 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 Bil LE 5/5 strength but pt reports fatigue compared to baseline currently. Pt with slow cautious gait and decreased activity tolerance who will benefit from acute therapy to maximize mobility and gait to return pt to independent function for return home. REcommend daily mobility with nursing supervision.     Follow Up Recommendations No PT follow up    Equipment Recommendations  None recommended by PT    Recommendations for Other Services       Precautions / Restrictions Precautions Precautions: Fall Restrictions Weight Bearing Restrictions: No      Mobility  Bed Mobility Overal bed mobility: Modified Independent             General bed mobility comments: in chair on arrival  Transfers Overall transfer level: Needs assistance   Transfers: Sit to/from Stand Sit to Stand: Supervision         General transfer comment: supervision for safety, no AD  Ambulation/Gait Ambulation/Gait assistance: Supervision Ambulation Distance (Feet): 250 Feet Assistive device: None Gait Pattern/deviations: Step-through pattern;Decreased stride length   Gait velocity interpretation: Below normal speed for age/gender General Gait Details: forward head with slow cautious gait, one partial LOB with pt able to recover without assist. Cues for posture, increased stride and directional cues  Stairs Stairs: Yes Stairs assistance: Modified independent (Device/Increase time) Stair Management: One rail Left;Alternating pattern;Forwards Number of Stairs: 3    Wheelchair Mobility     Modified Rankin (Stroke Patients Only)       Balance Overall balance assessment: Needs assistance Sitting-balance support: No upper extremity supported;Feet supported Sitting balance-Leahy Scale: Good     Standing balance support: No upper extremity supported;During functional activity Standing balance-Leahy Scale: Fair                               Pertinent Vitals/Pain Pain Assessment: No/denies pain    Home Living Family/patient expects to be discharged to:: Private residence Living Arrangements: Spouse/significant other Available Help at Discharge: Family;Available 24 hours/day Type of Home: House Home Access: Stairs to enter Entrance Stairs-Rails: Right;Left;Can reach both Entrance Stairs-Number of Steps: 3 Home Layout: One level Home Equipment: Bedside commode;Walker - 2 wheels Additional Comments: DME belongs to wife    Prior Function Level of Independence: Independent               Hand Dominance   Dominant Hand: Right    Extremity/Trunk Assessment   Upper Extremity Assessment: Defer to OT evaluation           Lower Extremity Assessment: Overall WFL for tasks assessed      Cervical / Trunk Assessment: Other exceptions  Communication   Communication: No difficulties  Cognition Arousal/Alertness: Awake/alert Behavior During Therapy: WFL for tasks assessed/performed Overall Cognitive Status: Within Functional Limits for tasks assessed                      General Comments      Exercises        Assessment/Plan    PT Assessment Patient needs continued PT  services  PT Diagnosis Difficulty walking   PT Problem List Decreased activity tolerance;Decreased mobility  PT Treatment Interventions Gait training;Functional mobility training;Therapeutic activities;Patient/family education   PT Goals (Current goals can be found in the Care Plan section) Acute Rehab PT Goals Patient Stated Goal: go home PT Goal Formulation:  With patient Time For Goal Achievement: 07/15/15 Potential to Achieve Goals: Good    Frequency Min 3X/week   Barriers to discharge        Co-evaluation               End of Session   Activity Tolerance: Patient tolerated treatment well Patient left: in chair;with call bell/phone within reach Nurse Communication: Mobility status         Time: 1610-9604 PT Time Calculation (min) (ACUTE ONLY): 14 min   Charges:   PT Evaluation $Initial PT Evaluation Tier I: 1 Procedure     PT G CodesDelorse Lek 07/08/2015, 1:28 PM Delaney Meigs, PT [phone]

## 2015-07-08 NOTE — Progress Notes (Signed)
UR completed 

## 2015-07-08 NOTE — Progress Notes (Signed)
  Date: 07/08/2015  Patient name: Glenn Harrell  Medical record number: [id number]  Date of birth: 07/18/1936   I have seen and evaluated Glenn Harrell and discussed their care with the Residency Team.   Filed Vitals:   07/08/15 1104  BP:   Pulse: 71  Temp: 98.2 F (36.8 C)  Resp: 18  BP 109/57 Gen NAD. Lying in bed HRRR ABD + BS, S/NT Pysch alert, conversant, appropriate mood and affect Skin no rashes  Assessment and Plan: I have seen and evaluated the patient as outlined above. I agree with the formulated Assessment and Plan as detailed in the residents' admission note, with the following changes:   1. 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 The cause of SIADH is not clear at this time though. CXR has R/O infxn or malignancy. No other sxs / signs of malignancy (does have h/o hematuria but reportedly recent cystoscopy R/O cause of hematuria). The cystoscopy is not temporarily related to the hyponatremia bc if the glycine was the cause the serum osmolality would be nl or high and the hyponatremia would have developed hrs rather than 2 weeks later. He is on HCTZ but also on an ACEI.   He has responded to IVF and most recent Na level is 124 - up 8 mEq in about 13 hrs. He is now only on fluid restriction. Cont to follow Na. His sxs have mostly resolved - still states a bit weak but PT/OT ID'd no needs.   2. Complicated UTI - both 2/2 male, recent ABX, and recent cystoscopy. Agree with rocephin and follow cxs.  3. Thrush - topical tx.  Anticipate D/C in 24-48 hrs, depending on response of sodium.  Burns Spain, MD 8/30/20161:51 PM

## 2015-07-08 NOTE — Progress Notes (Signed)
Subjective:  [name] said that he is doing much better today. He says that he is able to walk around without losing his balance and he feels much less weak.   His major complaint is that he continues to have issues with acid reflux. He reports the Pepcid helped, but his symptoms have returned this morning. Today, he says the reflux is worsened with food. He denies any dysphagia, odynophagia, or any unintended weight loss.  Objective: Vital signs in last 24 hours: Filed Vitals:   07/08/15 0922 07/08/15 1057 07/08/15 1104 07/08/15 1451  BP: 104/60 109/57  100/52  Pulse: 74  71 72  Temp: 97.6 F (36.4 C)  98.2 F (36.8 C) 98.4 F (36.9 C)  TempSrc: Oral  Oral Oral  Resp: 16  18 18   Height:      Weight:      SpO2: 99%  98% 98%   Weight change:   Intake/Output Summary (Last 24 hours) at 07/08/15 1752 Last data filed at 07/08/15 1409  Gross per 24 hour  Intake    530 ml  Output   3650 ml  Net  -3120 ml   Physical Exam: General: Lying in bed, no acute distress HEENT: White spots on tonsils and tongue with thrush. Some tonsillar erythema. EOMI, PERRL.  Cardiac: RRR without murmurs. Normal S1, S2 Pulmonary: Clear to auscultation bilaterally without wheezes, crackles, or rhonchi Abdomen: No suprapubic tenderness. Soft, nontender, nondistended. Ext: Trace edema to ankle. No cyanosis or clubbing Neuro: Alert and oriented x3. 5/5 strength in all extremities.  Lab Results: Basic Metabolic Panel:  Recent Labs Lab 07/08/15 0810 07/08/15 1217  NA 123* 124*  K 3.8 3.5  CL 86* 88*  CO2 26 26  GLUCOSE 101* 121*  BUN 16 19  CREATININE 1.14 1.31*  CALCIUM 9.2 9.0   Serum Osmolality: 248 (L) Urine Sodium: 38 Urine Osmolality: 207 (L)  AM Cortisol: 15.3 TSH: 0.609  Liver Function Tests:  Recent Labs Lab 07/07/15 1046  AST 40  ALT 38  ALKPHOS 61  BILITOT 1.0  PROT 7.2  ALBUMIN 3.7   CBC:  Recent Labs Lab 07/07/15 1046 07/08/15 0410  WBC 12.6* 10.0    NEUTROABS 12.5*  --   HGB 15.6 15.3  HCT 42.3 42.4  MCV 85.5 86.2  PLT 317 341   Cardiac Enzymes:  Recent Labs Lab 07/07/15 1046  TROPONINI <0.03    Recent Labs Lab 07/07/15 1858  TSH 0.609   Urinalysis:  Recent Labs Lab 07/07/15 1012  COLORURINE YELLOW  LABSPEC 1.013  PHURINE 6.0  GLUCOSEU NEGATIVE  HGBUR SMALL*  BILIRUBINUR NEGATIVE  KETONESUR NEGATIVE  PROTEINUR NEGATIVE  UROBILINOGEN 0.2  NITRITE POSITIVE*  LEUKOCYTESUR MODERATE*    Micro Results: Recent Results (from the past 240 hour(s))  Culture, Urine     Status: None (Preliminary result)   Collection Time: 07/07/15 10:49 PM  Result Value Ref Range Status   Specimen Description URINE, RANDOM  Final   Special Requests Ceftriaxone Normal  Final   Culture CULTURE REINCUBATED FOR BETTER GROWTH  Final   Report Status PENDING  Incomplete   Studies/Results: Dg Chest 2 View  07/07/2015   CLINICAL DATA:  Shortness of breath.  Pneumonia.  Initial encounter.  EXAM: CHEST  2 VIEW  COMPARISON:  None.  FINDINGS: The lungs are clear. Heart size is mildly enlarged. No pneumothorax or pleural effusion. No focal bony abnormality.  IMPRESSION: No acute disease.   Electronically Signed   By: Maisie Fus  Dalessio M.D.   On: 07/07/2015 10:31   Medications: I have reviewed the patient's current medications. Scheduled Meds: . amLODipine  10 mg Oral Daily  . aspirin  81 mg Oral Daily  . calcium carbonate  2 tablet Oral Once  . cefTRIAXone (ROCEPHIN)  IV  1 g Intravenous Q24H  . enoxaparin (LOVENOX) injection  40 mg Subcutaneous Q24H  . famotidine  40 mg Oral BID  . fluticasone  2 spray Each Nare Daily  . nystatin  5 mL Mouth/Throat QID  . pravastatin  40 mg Oral QHS  . sodium chloride  3 mL Intravenous Q12H  . sodium chloride  3 mL Intravenous Q12H   Continuous Infusions:  PRN Meds:. 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 However, the origin is unknown. No concern for malignancy at  this time given no abrupt weight loss or findings and no findings of Chest Xray. He was previously taking HCTZ, but it is unlikely to cause hyponatremia this severe in isolation. Has been improving to 124 from 116, 8 mEq over a 25 r period. - Stop IVF, fluid restriction only to <800 ml/day - BMP at 1800 and again tomorrow morning - HIV pending   Urinary Tract Infection: Likely due to recent cystoscopy. - Ceftriaxone 1g IV BID  Denture-Associated Stomatitis: Nystatin mouth wash  Sinusitis: Toward the end of prednisone regimen, but about a week of chloramphenicol left. Will hold both. - Ceftriaxone per above - Fluticasone spray daily  GERD: Appears to be longstanding issue acutely worsening after sinusitis.  - Increase famotidine to 40 mg BID - Consider adding PPI  HTN: Continue home amlodipine 10 mg daily  HLD: Continue home pravastatin 40 mg daily, ASA 81 mg daily  DVT Prophylaxis: Lovenox  Dispo: Disposition is deferred at this time, awaiting improvement of current medical problems.  Anticipated discharge in approximately 1-2 day(s).   The patient does have a current PCP, Dr. Knox Royalty, MD, and does not need an [facility] hospital follow-up appointment after discharge.  The patient does not have transportation limitations that hinder transportation to clinic appointments.  .Services Needed at time of discharge: Y = Yes, Blank = No PT:   OT:   RN:   Equipment:   Other:     LOS: 1 day   Ruben Im, MD 07/08/2015, 5:52 PM

## 2015-07-08 NOTE — Evaluation (Signed)
Occupational Therapy Evaluation Patient Details Name: Glenn Harrell MRN: [id number] DOB: [birth year] Today's Date: 07/08/2015    History of Present Illness Glenn Harrell is a 79 yo man with a PMH of HTN and HLD who presents with worsening weakness. Pt with UTI and hyponatremia   Clinical Impression   Patient presenting with deconditioning secondary to above. Patient independent PTA. Patient currently functioning at an overall supervision level. Patient will benefit from acute OT to increase overall independence in the areas of ADLs, functional mobility, and overall safety to reach a mod I level in order to safely discharge home with intermittent supervision/assistance from wife.     Follow Up Recommendations  No OT follow up;Supervision - Intermittent    Equipment Recommendations  None recommended by OT    Recommendations for Other Services  None at this time    Precautions / Restrictions Precautions Precautions: Fall Restrictions Weight Bearing Restrictions: No    Mobility Bed Mobility Overal bed mobility: Modified Independent General bed mobility comments: in chair on arrival  Transfers Overall transfer level: Needs assistance   Transfers: Sit to/from Stand Sit to Stand: Supervision General transfer comment: supervision for safety, no AD    Balance Overall balance assessment: Needs assistance Sitting-balance support: No upper extremity supported;Feet supported Sitting balance-Leahy Scale: Good     Standing balance support: No upper extremity supported;During functional activity Standing balance-Leahy Scale: Fair    ADL Overall ADL's : Needs assistance/impaired General ADL Comments: Pt overall supervision for ADLs. Pt a little unsteady on feet and requires up to min guard at times for functional mobility without the use of an AD. Will await PT evaluation to determine if pt will need an AD for mobility.     Pertinent Vitals/Pain Pain Assessment: No/denies pain      Hand Dominance Right   Extremity/Trunk Assessment Upper Extremity Assessment Upper Extremity Assessment: Defer to OT evaluation   Lower Extremity Assessment Lower Extremity Assessment: Overall WFL for tasks assessed   Cervical / Trunk Assessment Cervical / Trunk Assessment: Other exceptions Cervical / Trunk Exceptions: forward head   Communication Communication Communication: No difficulties   Cognition Arousal/Alertness: Awake/alert Behavior During Therapy: WFL for tasks assessed/performed Overall Cognitive Status: Within Functional Limits for tasks assessed              Home Living Family/patient expects to be discharged to:: Private residence Living Arrangements: Spouse/significant other Available Help at Discharge: Family;Available 24 hours/day Type of Home: House Home Access: Stairs to enter [employer] of Steps: 3 Entrance Stairs-Rails: Right;Left;Can reach both Home Layout: One level     Bathroom Shower/Tub: IT trainer: Standard     Home Equipment: Bedside commode;Walker - 2 wheels   Additional Comments: DME belongs to wife      Prior Functioning/Environment Level of Independence: Independent     OT Diagnosis: Generalized weakness   OT Problem List: Decreased activity tolerance;Impaired balance (sitting and/or standing);Decreased knowledge of use of DME or AE   OT Treatment/Interventions: Self-care/ADL training;Therapeutic exercise;Energy conservation;DME and/or AE instruction;Therapeutic activities;Patient/family education;Balance training    OT Goals(Current goals can be found in the care plan section) Acute Rehab OT Goals Patient Stated Goal: go home OT Goal Formulation: With patient Time For Goal Achievement: 07/22/15 Potential to Achieve Goals: Good ADL Goals Pt Will Perform Grooming: with modified independence;sitting Pt Will Perform Lower Body Bathing: Independently;sit to/from stand Pt Will  Perform Lower Body Dressing: Independently;sit to/from stand Pt Will Transfer to Toilet: with modified independence;ambulating;bedside commode Pt  Will Perform Tub/Shower Transfer: Tub transfer;shower seat;ambulating Pt/caregiver will Perform Home Exercise Program: Increased strength;Both right and left upper extremity;With Supervision;With written HEP provided;With theraband  OT Frequency: Min 2X/week   Barriers to D/C: None known at this time   End of Session Nurse Communication: Mobility status  Activity Tolerance: Patient tolerated treatment well Patient left: in chair;with call bell/phone within reach   Time: 1147-1202 OT Time Calculation (min): 15 min Charges:  OT General Charges $OT Visit: 1 Procedure OT Evaluation $Initial OT Evaluation Tier I: 1 Procedure  Glenn Harrell , MS, OTR/L, CLT Pager: [phone]  07/08/2015, 1:28 PM

## 2015-07-08 NOTE — Progress Notes (Signed)
Osmolality 248 called in this am by Lorelee New lab tech.  Paged Dr. Ala Dach on call this am.  Waiting for  return call.  Amanda Pea, Charity fundraiser.

## 2015-07-09 ENCOUNTER — Encounter (HOSPITAL_COMMUNITY): Payer: Self-pay | Admitting: General Practice

## 2015-07-09 LAB — BASIC METABOLIC PANEL
Anion gap: 8 (ref 5–15)
Anion gap: 9 (ref 5–15)
BUN: 18 mg/dL (ref 6–20)
BUN: 18 mg/dL (ref 6–20)
CALCIUM: 8.6 mg/dL — AB (ref 8.9–10.3)
CALCIUM: 8.8 mg/dL — AB (ref 8.9–10.3)
CHLORIDE: 92 mmol/L — AB (ref 101–111)
CO2: 25 mmol/L (ref 22–32)
CO2: 25 mmol/L (ref 22–32)
Chloride: 91 mmol/L — ABNORMAL LOW (ref 101–111)
Creatinine, Ser: 1.14 mg/dL (ref 0.61–1.24)
Creatinine, Ser: 1.28 mg/dL — ABNORMAL HIGH (ref 0.61–1.24)
GFR calc non Af Amer: >60 mL/min (ref >60)
GFR calc non Af Amer: 52 mL/min — ABNORMAL LOW (ref >60)
Glucose, Bld: 107 mg/dL — ABNORMAL HIGH (ref 65–99)
Glucose, Bld: 137 mg/dL — ABNORMAL HIGH (ref 65–99)
Potassium: 3.5 mmol/L (ref 3.5–5.1)
Potassium: 3.5 mmol/L (ref 3.5–5.1)
SODIUM: 124 mmol/L — AB (ref 135–145)
SODIUM: 126 mmol/L — AB (ref 135–145)

## 2015-07-09 LAB — HIV ANTIBODY (ROUTINE TESTING W REFLEX): HIV SCREEN 4TH GENERATION: NONREACTIVE

## 2015-07-09 MED ORDER — PANTOPRAZOLE SODIUM 40 MG PO TBEC: 40.0000 mg | DELAYED_RELEASE_TABLET | Freq: Every evening | ORAL | Status: AC

## 2015-07-09 MED ORDER — FLUTICASONE PROPIONATE 50 MCG/ACT NA SUSP: 2.0000 | Freq: Every day | NASAL | Status: AC

## 2015-07-09 MED ORDER — CIPROFLOXACIN HCL 500 MG PO TABS
500.0000 mg | ORAL_TABLET | Freq: Two times a day (BID) | ORAL | Status: DC
  Administered 2015-07-10: 500 mg via ORAL
  Filled 2015-07-09: qty 1

## 2015-07-09 MED ORDER — NYSTATIN 100000 UNIT/ML MT SUSP: 5.0000 mL | Freq: Four times a day (QID) | OROMUCOSAL | Status: AC

## 2015-07-09 MED ORDER — PANTOPRAZOLE SODIUM 40 MG PO TBEC
40.0000 mg | DELAYED_RELEASE_TABLET | Freq: Every evening | ORAL | Status: DC
  Administered 2015-07-09: 40 mg via ORAL
  Filled 2015-07-09: qty 1

## 2015-07-09 MED ORDER — CIPROFLOXACIN HCL 500 MG PO TABS: 500.0000 mg | ORAL_TABLET | Freq: Two times a day (BID) | ORAL | Status: DC

## 2015-07-09 MED ORDER — SUCRALFATE 1 GM/10ML PO SUSP
1.0000 g | Freq: Once | ORAL | Status: AC
  Administered 2015-07-09: 1 g via ORAL
  Filled 2015-07-09: qty 10

## 2015-07-09 NOTE — Progress Notes (Signed)
Physical Therapy Treatment Patient Details Name: Glenn Harrell MRN: [id number] DOB: 10-01-1936 Today's Date: 07/09/2015    History of Present Illness Glenn Harrell is a 79 yo man with a PMH of HTN and HLD who presents with worsening weakness. Pt with UTI and hyponatremia    PT Comments    Pt progressing well with mobility, worked on gait pattern and balance today. Pt with shuffling gait with decreased speed, indicative of increased fall risk. For this reason, changed d/c recommendation to HHPT. PT will continue to follow.   Follow Up Recommendations  Home health PT;Supervision - Intermittent     Equipment Recommendations  None recommended by PT    Recommendations for Other Services       Precautions / Restrictions Precautions Precautions: Fall Restrictions Weight Bearing Restrictions: No    Mobility  Bed Mobility Overal bed mobility: Modified Independent                Transfers Overall transfer level: Modified independent Equipment used: None Transfers: Sit to/from Stand Sit to Stand: Modified independent (Device/Increase time)         General transfer comment: stood from chair safely without assist  Ambulation/Gait Ambulation/Gait assistance: Supervision Ambulation Distance (Feet): 350 Feet Assistive device: None Gait Pattern/deviations: Step-through pattern;Trunk flexed Gait velocity: appropriate for household ambulation but not yet community Gait velocity interpretation:  (between 1.8 and 2.62 ft/sec) General Gait Details: pt ambulates with flexed truk and slight fwd lean with a shuffle, almost a festinating pattern. Discussed erect posture and keeping wt over feet because his trunk is tending to move faster than his feet. Also worked on step height and pt showed some improvement with this. Would really benefit from more work on gait and Magazine features editor    Modified Rankin (Stroke Patients Only)       Balance  Overall balance assessment: Needs assistance Sitting-balance support: No upper extremity supported Sitting balance-Leahy Scale: Good     Standing balance support: No upper extremity supported Standing balance-Leahy Scale: Good Standing balance comment: can maintain balance in rhomberg stance with dynamic motion of trunk, can maintain balance without UE support in tandem stance but cannot perform any dynamic activity and can only maintain 3 sec. Cannot achieve SL stance without UE support                    Cognition Arousal/Alertness: Awake/alert Behavior During Therapy: WFL for tasks assessed/performed Overall Cognitive Status: Within Functional Limits for tasks assessed                      Exercises      General Comments General comments (skin integrity, edema, etc.): discussed progression of balance exercises to perform at home.       Pertinent Vitals/Pain Pain Assessment: No/denies pain  O2 sats 98%, HR 90 bpm    Home Living                      Prior Function            PT Goals (current goals can now be found in the care plan section) Acute Rehab PT Goals Patient Stated Goal: go home PT Goal Formulation: With patient Time For Goal Achievement: 07/15/15 Potential to Achieve Goals: Good Progress towards PT goals: Progressing toward goals    Frequency  Min 3X/week    PT Plan Discharge plan  needs to be updated    Co-evaluation             End of Session Equipment Utilized During Treatment: Gait belt Activity Tolerance: Patient tolerated treatment well Patient left: in chair;with call bell/phone within reach;with family/visitor present     Time: 1610-9604 PT Time Calculation (min) (ACUTE ONLY): 35 min  Charges:  $Gait Training: 23-37 mins                    G Codes:     Lyanne Co, PT  Acute Rehab Services  [phone]  Lyanne Co 07/09/2015, 3:17 PM

## 2015-07-09 NOTE — Progress Notes (Signed)
  Date: 07/09/2015  Patient name: Glenn Harrell  Medical record number: [id number]  Date of birth: 1936-08-08   This patient has been seen and the plan of care was discussed with the house staff. Please see their note for complete details. I concur with their findings with the following additions/corrections: Urine cx is growing 20,000 colonies of pseudomonas. Sensitivities are pending. Sodium is now 124 - steadily improving after med change and fluid restriction. Although his sodium is not normal, it is improving and I am confident that it will cont to normalize with continuation of our tx as an outpt. We will arrange close outpt F/U to repeat the sodium level and F/U urine sensitivities.   Burns Spain, MD 07/09/2015, 1:43 PM

## 2015-07-09 NOTE — Discharge Instructions (Addendum)
[name], you were seen in the hospital because your sodium was too low. While it is unclear what caused it, your sodium has been improving in the hospital and you are doing much better. Your sodium was 116 at first and improved to 127. We attempted to make an appointment for you with your primary doctor, Dr. Yetta Barre, but his office will be closed for the rest of the week. It is very important you make this clinic appointment to make sure your sodium is improving. For now, do not drink more than one bottled water per day.  It's possible that one of the blood pressure medications may have played a part in your low sodium, but it rarely would cause your sodium to go as low as 116. While you were in the hospital, your blood pressure was normal, so we have at least temporarily discontinued the Lisinopril-HCTZ pill. Continue taking the Amlodpine. Your doctor, Dr. Yetta Barre, may choose to restart these at your appointment with him on 9/6 at 1:30 pm. At this appointment, you'll discuss your hypertension and whether the new drug, Protonix, is working for your acid reflux.  We also found that you had a urinary tract infection while in the hospital. We have been treating that with antibiotics. You will continue these antibiotics at home. The antibiotic is called Cipro, and you will take it twice a day starting and will finish (9/9)  If you feel as though your balance is worsening, you start becoming confused,  or you feel like you're going to faint, please seek medical attention immediately. If you have chest pain, shortness of breath, or temperature over 100.4, please seek medical attention.

## 2015-07-09 NOTE — Progress Notes (Signed)
OT Cancellation Note  Patient Details Name: Glenn Harrell MRN: [id number] DOB: [birth year]   Cancelled Treatment:    Reason Eval/Treat Not Completed: Pt eating lunch.  will reattempt  Angelene Giovanni Katrinka Herbison, OTR/L [phone]  07/09/2015, 2:05 PM

## 2015-07-09 NOTE — Progress Notes (Signed)
Subjective:  Glenn Harrell said that he was doing well and was glad his sodium was improved. Him and his wife were most concerned about the persistent acid reflux despite the Pepcid.   He was seen again later in the day when he had an episode of non-bloody, non-bilious emesis. The vomit contained food particles from his previous lunch.   Objective: Vital signs in last 24 hours: Filed Vitals:   07/09/15 0208 07/09/15 0651 07/09/15 0900 07/09/15 1400  BP: 137/65 128/59 108/70 100/60  Pulse: 78 75 83 83  Temp: 97.1 F (36.2 C) 98 F (36.7 C) 98.1 F (36.7 C) 97.8 F (36.6 C)  TempSrc: Oral Oral Oral Oral  Resp: 18 18 18 18   Height:      Weight:  203 lb 6.4 oz (92.262 kg)    SpO2: 99% 98% 100% 98%   Weight change: -6 lb 12.8 oz (-3.084 kg)  Intake/Output Summary (Last 24 hours) at 07/09/15 1838 Last data filed at 07/09/15 0900  Gross per 24 hour  Intake    240 ml  Output   1900 ml  Net  -1660 ml   Physical Exam: General: Sitting up in a chair. Cardiac: RRR without murmurs. Normal S1, S2 Pulmonary: Clear to auscultation bilaterally without wheezes, crackles, or rhonchi Abdomen: No suprapubic tenderness. Soft, nontender, nondistended. Ext: No edema. No cyanosis or clubbing Neuro: Alert and oriented x3. 5/5 strength in all extremities.  Lab Results: Basic Metabolic Panel:  Recent Labs Lab 07/09/15 0544 07/09/15 1110  NA 124* 126*  K 3.5 3.5  CL 91* 92*  CO2 25 25  GLUCOSE 107* 137*  BUN 18 18  CREATININE 1.14 1.28*  CALCIUM 8.6* 8.8*   Serum Osmolality: 248 (L) Urine Sodium: 38 Urine Osmolality: 207 (L)  AM Cortisol: 15.3 TSH: 0.609  Liver Function Tests:  Recent Labs Lab 07/07/15 1046  AST 40  ALT 38  ALKPHOS 61  BILITOT 1.0  PROT 7.2  ALBUMIN 3.7   CBC:  Recent Labs Lab 07/07/15 1046 07/08/15 0410  WBC 12.6* 10.0  NEUTROABS 12.5*  --   HGB 15.6 15.3  HCT 42.3 42.4  MCV 85.5 86.2  PLT 317 341   Cardiac Enzymes:  Recent Labs Lab  07/07/15 1046  TROPONINI <0.03    Recent Labs Lab 07/07/15 1858  TSH 0.609   Urinalysis:  Recent Labs Lab 07/07/15 1012  COLORURINE YELLOW  LABSPEC 1.013  PHURINE 6.0  GLUCOSEU NEGATIVE  HGBUR SMALL*  BILIRUBINUR NEGATIVE  KETONESUR NEGATIVE  PROTEINUR NEGATIVE  UROBILINOGEN 0.2  NITRITE POSITIVE*  LEUKOCYTESUR MODERATE*    Micro Results: Recent Results (from the past 240 hour(s))  Culture, Urine     Status: None (Preliminary result)   Collection Time: 07/07/15 10:49 PM  Result Value Ref Range Status   Specimen Description URINE, RANDOM  Final   Special Requests Ceftriaxone Normal  Final   Culture 20,000 COLONIES/mL PSEUDOMONAS AERUGINOSA  Final   Report Status PENDING  Incomplete   Studies/Results: No results found. Medications: I have reviewed the patient's current medications. Scheduled Meds: . amLODipine  10 mg Oral Daily  . aspirin  81 mg Oral Daily  . [START ON 07/10/2015] ciprofloxacin  500 mg Oral BID  . enoxaparin (LOVENOX) injection  40 mg Subcutaneous Q24H  . fluticasone  2 spray Each Nare Daily  . nystatin  5 mL Mouth/Throat QID  . pantoprazole  40 mg Oral QPM  . pravastatin  40 mg Oral QHS  . sodium chloride  3 mL Intravenous Q12H  . sodium chloride  3 mL Intravenous Q12H   Continuous Infusions:  PRN Meds:. 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 However, the origin is unknown. No concern for malignancy at this time given no abrupt weight loss or findings and no findings of Chest Xray. He was previously taking HCTZ, but it is unlikely to cause hyponatremia this severe in isolation. Has been improving to 126 from 116, 8 mEq over a 48 hr period. - Off fluids, fluid restriction only to <800 ml/day - BMP in the morning  Complicated Urinary Tract Infection: Likely due to recent cystoscopy. Will treat as complicated given male and recent procedure. - Will transition from IV ceftriaxone to 500 mg cipro BID for 10  days.  Denture-Associated Stomatitis: Nystatin mouth wash  Sinusitis: Toward the end of prednisone regimen, but about a week of chloramphenicol left. Will hold both. - Fluticasone spray daily  GERD: Appears to be longstanding issue acutely worsening after sinusitis. Has led to vomiting episode - Trial of sucralfate tonight - Start Protonix 40 mg every evening  HTN: Continue amlodipine 10 mg daily. Holding lisinopril-hctz given hyponatremia. Pressures have been normal.  HLD: Continue home pravastatin 40 mg daily, ASA 81 mg daily  DVT Prophylaxis: Lovenox  Dispo:  Anticipated discharge tomorrow.  The patient does have a current PCP, Dr. Knox Royalty, MD, and does not need an [facility] hospital follow-up appointment after discharge.  The patient does not have transportation limitations that hinder transportation to clinic appointments.  .Services Needed at time of discharge: Y = Yes, Blank = No PT: Yes  OT:   RN:   Equipment:   Other:     LOS: 2 days   Ruben Im, MD 07/09/2015, 6:38 PM

## 2015-07-10 LAB — BASIC METABOLIC PANEL
Anion gap: 9 (ref 5–15)
BUN: 20 mg/dL (ref 6–20)
CO2: 25 mmol/L (ref 22–32)
Calcium: 8.7 mg/dL — ABNORMAL LOW (ref 8.9–10.3)
Chloride: 93 mmol/L — ABNORMAL LOW (ref 101–111)
Creatinine, Ser: 1.22 mg/dL (ref 0.61–1.24)
GFR calc Af Amer: >60 mL/min (ref >60)
GFR, EST NON AFRICAN AMERICAN: 55 mL/min — AB (ref >60)
GLUCOSE: 110 mg/dL — AB (ref 65–99)
Potassium: 3.4 mmol/L — ABNORMAL LOW (ref 3.5–5.1)
Sodium: 127 mmol/L — ABNORMAL LOW (ref 135–145)

## 2015-07-10 LAB — URINE CULTURE: Special Requests: NORMAL

## 2015-07-10 MED ORDER — CIPROFLOXACIN HCL 500 MG PO TABS: 500.0000 mg | ORAL_TABLET | Freq: Two times a day (BID) | ORAL | Status: AC

## 2015-07-10 NOTE — Progress Notes (Signed)
Subjective: Mr. Brafford reports feeling well today. He reports no symptoms today. His acid reflux has improved significantly with the Protonix and Sucralfate. He was able to sleep well last night. Denies any cough. No longer having any emesis.   Objective: Vital signs in last 24 hours: Filed Vitals:   07/09/15 0900 07/09/15 1400 07/09/15 2126 07/10/15 0553  BP: 108/70 100/60 125/72 119/73  Pulse: 83 83 79 83  Temp: 98.1 F (36.7 C) 97.8 F (36.6 C) 98.1 F (36.7 C) 98.6 F (37 C)  TempSrc: Oral Oral Oral Oral  Resp: Height:      Weight:    203 lb (92.08 kg)  SpO2: 100% 98% 99% 100%   Weight change: -6.4 oz (-0.181 kg)  Intake/Output Summary (Last 24 hours) at 07/10/15 1304 Last data filed at 07/10/15 1117  Gross per 24 hour  Intake    415 ml  Output      0 ml  Net    415 ml   Physical Exam GENERAL- AAOX3, co-operative, appears as stated age, not in any distress. CARDIAC- RRR, no murmurs, rubs or gallops. RESP- Moving equal volumes of air, and clear to auscultation bilaterally, no wheezes or crackles. ABDOMEN- Soft, nontender, no guarding or rebound, bowel sounds present. NEURO- No obvious Cr N abnormality EXTREMITIES- pulse 2+, symmetric, no pedal edema. SKIN- Warm, dry, No rash or lesion. PSYCH- Normal mood and affect, appropriate thought content and speech.  Lab Results: Basic Metabolic Panel:  Recent Labs Lab 07/09/15 1110 07/10/15 0618  NA 126* 127*  K 3.5 3.4*  CL 92* 93*  CO2 25 25  GLUCOSE 137* 110*  BUN 18 20  CREATININE 1.28* 1.22  CALCIUM 8.8* 8.7*   Liver Function Tests:  Recent Labs Lab 07/07/15 1046  AST 40  ALT 38  ALKPHOS 61  BILITOT 1.0  PROT 7.2  ALBUMIN 3.7   CBC:  Recent Labs Lab 07/07/15 1046 07/08/15 0410  WBC 12.6* 10.0  NEUTROABS 12.5*  --   HGB 15.6 15.3  HCT 42.3 42.4  MCV 85.5 86.2  PLT 317 341   Cardiac Enzymes:  Recent Labs Lab 07/07/15 1046  TROPONINI <0.03   Thyroid Function  Tests:  Recent Labs Lab 07/07/15 1858  TSH 0.609   Urinalysis:  Recent Labs Lab 07/07/15 1012  COLORURINE YELLOW  LABSPEC 1.013  PHURINE 6.0  GLUCOSEU NEGATIVE  HGBUR SMALL*  BILIRUBINUR NEGATIVE  KETONESUR NEGATIVE  PROTEINUR NEGATIVE  UROBILINOGEN 0.2  NITRITE POSITIVE*  LEUKOCYTESUR MODERATE*   Micro Results: Recent Results (from the past 240 hour(s))  Culture, Urine     Status: None   Collection Time: 07/07/15 10:49 PM  Result Value Ref Range Status   Specimen Description URINE, RANDOM  Final   Special Requests Ceftriaxone Normal  Final   Culture 20,000 COLONIES/mL PSEUDOMONAS AERUGINOSA  Final   Report Status 07/10/2015 FINAL  Final   Organism ID, Bacteria PSEUDOMONAS AERUGINOSA  Final      Susceptibility   Pseudomonas aeruginosa - MIC*    CEFTAZIDIME 4 SENSITIVE Sensitive     CIPROFLOXACIN <=0.25 SENSITIVE Sensitive     GENTAMICIN <=1 SENSITIVE Sensitive     IMIPENEM 2 SENSITIVE Sensitive     PIP/TAZO 8 SENSITIVE Sensitive     CEFEPIME 2 SENSITIVE Sensitive     * 20,000 COLONIES/mL PSEUDOMONAS AERUGINOSA   Studies/Results: No results found. Medications: I have reviewed the patient's current medications. Scheduled Meds: . amLODipine  10 mg Oral Daily  .  aspirin  81 mg Oral Daily  . ciprofloxacin  500 mg Oral BID  . enoxaparin (LOVENOX) injection  40 mg Subcutaneous Q24H  . fluticasone  2 spray Each Nare Daily  . nystatin  5 mL Mouth/Throat QID  . pantoprazole  40 mg Oral QPM  . pravastatin  40 mg Oral QHS  . sodium chloride  3 mL Intravenous Q12H  . sodium chloride  3 mL Intravenous Q12H   Continuous Infusions:  PRN Meds:. Assessment/Plan: Principal Problem:   Hyponatremia Active Problems:   AKI (acute kidney injury)   Essential hypertension   Hyperlipidemia   UTI (urinary tract infection)  Hyponatremia: Improved to 127 today from 116 on admission. 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 No concern for  malignancy at this time given no abrupt weight loss or findings and no findings of Chest Xray. He was previously taking Lisinopril-HCTZ, but it is unlikely to cause hyponatremia this severe in isolation. - Off fluids, fluid restriction only to <800 ml/day - Ready for discharge today, follow up with PCP for recheck on 9/6  Complicated Urinary Tract Infection: Likely due to recent cystoscopy. Will treat as complicated given male and recent procedure. - Will transition from IV ceftriaxone to 500 mg cipro BID for total of 10 days.  Denture-Associated Stomatitis: Nystatin mouth wash  Sinusitis: Toward the end of prednisone regimen, but about a week of chloramphenicol left. Will hold both. - Fluticasone spray daily  GERD: Appears to be longstanding issue acutely worsening after sinusitis. Has led to vomiting episode - Will stop sucralfate on discharge - Continue Protonix 40 mg daily   HTN: Continue amlodipine 10 mg daily. Stopping lisinopril-hctz given hyponatremia. Pressures have been normal.  HLD: Continue home pravastatin 40 mg daily, ASA 81 mg daily  DVT Prophylaxis: Lovenox  Dispo: Ready for discharge today  The patient does have a current PCP Knox Royalty, MD) and does need an [facility] hospital follow-up appointment after discharge.  The patient does not have transportation limitations that hinder transportation to clinic appointments.  .Services Needed at time of discharge: Y = Yes, Blank = No PT:   OT:   RN:   Equipment:   Other:     LOS: 3 days   Valentino Nose, MD 07/10/2015, 1:04 PM

## 2015-07-10 NOTE — Progress Notes (Signed)
Internal Medicine Attending  Date: 07/10/2015  Patient name: Glenn Harrell Medical record number: [id number] Date of birth: Dec 22, 1935 Age: 79 y.o. Gender: male  I saw and evaluated the patient. I reviewed the resident's note by Dr. Karma Greaser and I agree with the resident's findings and plans as documented in his progress note.  [name] felt well this morning when seen on rounds. For sinusitis we will continue the ciprofloxacin and the Flonase. The cause of his hyponatremia is unclear, but he responded to a fluid restriction. His sodium today is 127. I agree with discharge home today with follow-up in his primary care provider's office next week as scheduled to repeat his sodium.

## 2015-07-10 NOTE — Progress Notes (Signed)
Occupational Therapy Treatment Patient Details Name: Glenn Harrell MRN: [id number] DOB: 04/17/1936 Today's Date: 07/10/2015    History of present illness Mr. Damiani is a 79 yo man with a PMH of HTN and HLD who presents with worsening weakness. Pt with UTI and hyponatremia   OT comments  Patient progressing well towards OT goals, continue plan of care for now. Goals set at an overall mod I level, pt overall distant supervision>mod I. Will keep patient on OT caseload to work towards mod I overall with ADLs, IADLs, and functional mobility.    Follow Up Recommendations  No OT follow up;Supervision - Intermittent    Equipment Recommendations  3 in 1 bedside comode    Recommendations for Other Services  None at this time   Precautions / Restrictions Precautions Precautions: Fall Restrictions Weight Bearing Restrictions: No    Mobility Bed Mobility General bed mobility comments: in chair on arrival  Transfers Overall transfer level: Modified independent Equipment used: None Transfers: Sit to/from Stand Sit to Stand: Modified independent (Device/Increase time) General transfer comment: stood from chair safely without assist    Balance Overall balance assessment: Needs assistance Sitting-balance support: No upper extremity supported;Feet supported Sitting balance-Leahy Scale: Good     Standing balance support: No upper extremity supported;During functional activity Standing balance-Leahy Scale: Fair    ADL Overall ADL's : Needs assistance/impaired General ADL Comments: Pt has made progress and is overall distant supervision>mod I with functional mobility and ADLs. Pt able to cross BLEs for LB ADLs. Pt ambulated <> BR for toilet transfer. Pt able to pick paper up off floor at distant supervision level. Pt ambulated in hallway for endurance. Recommend wife be present with pt during outside mobility at this time due to variety in grounds.     Cognition   Behavior During Therapy: WFL  for tasks assessed/performed Overall Cognitive Status: Within Functional Limits for tasks assessed                 Pertinent Vitals/ Pain       Pain Assessment: No/denies pain   Frequency Min 2X/week     Progress Toward Goals  OT Goals(current goals can now befound in the care plan section)  Progress towards OT goals: Progressing toward goals     Plan Discharge plan remains appropriate    End of Session     Activity Tolerance Patient tolerated treatment well   Patient Left in chair;with call bell/phone within reach;with family/visitor present    Time: 4098-1191 OT Time Calculation (min): 20 min  Charges: OT General Charges $OT Visit: 1 Procedure OT Treatments $Therapeutic Activity: 8-22 mins  Latiesha Harada , MS, OTR/L, CLT Pager: [phone]  07/10/2015, 9:31 AM

## 2015-07-11 ENCOUNTER — Ambulatory Visit: Payer: Medicare Other | Admitting: Internal Medicine

## 2015-07-13 NOTE — Discharge Summary (Signed)
Name: Glenn Harrell MRN: [id number] DOB: 03-09-1936 79 y.o. PCP: Knox Royalty, MD  Date of Admission: 07/07/2015  9:20 AM Date of Discharge: 07/10/2015 Attending Physician: Dr. Doneen Poisson  Discharge Diagnosis: Principal Problem:   Hyponatremia Active Problems:   AKI (acute kidney injury)   Essential hypertension   Hyperlipidemia   UTI (urinary tract infection)  Discharge Medications:   Medication List    STOP taking these medications        lisinopril-hydrochlorothiazide 20-12.5 MG per tablet  Commonly known as:  PRINZIDE,ZESTORETIC      TAKE these medications        amLODipine 10 MG tablet  Commonly known as:  NORVASC  Take 10 mg by mouth daily.     aspirin 81 MG chewable tablet  Chew 81 mg by mouth daily.     ciprofloxacin 500 MG tablet  Commonly known as:  CIPRO  Take 1 tablet (500 mg total) by mouth 2 (two) times daily.     fluticasone 50 MCG/ACT nasal spray  Commonly known as:  FLONASE  Place 2 sprays into both nostrils daily.     nystatin 100000 UNIT/ML suspension  Commonly known as:  MYCOSTATIN  Use as directed 5 mLs (500,000 Units total) in the mouth or throat 4 (four) times daily.     pantoprazole 40 MG tablet  Commonly known as:  PROTONIX  Take 1 tablet (40 mg total) by mouth every evening.     pravastatin 40 MG tablet  Commonly known as:  PRAVACHOL  Take 40 mg by mouth at bedtime.        Disposition and follow-up:   GlennGlenn Harrell was discharged from [facility] in Stable condition.  At the hospital follow up visit please address:  1.    Hyponatremia: Na was 127 on discharge on 9/1. He was asymptomatic with no weakness. Follow up BMP.  UTI: Completing 10 day course of Cipro.   HTN: Stopped Lisinopril-HCTZ. Continued Amlodipine. Pressures were normotensive in hospital.   2.  Labs / imaging needed at time of follow-up: BMP  3.  Pending labs/ test needing follow-up: None  Follow-up Appointments:     Follow-up  Information    Follow up with Paulino Rily, MD. Go on 07/15/2015.   Specialty:  Family Medicine   Why:  1:30 PM Appointment. Managment of acid reflux and hypertension   Contact information:   [address] [place] [address] [phone]       Discharge Instructions: Discharge Instructions    Ambulatory referral to Home Health    Complete by:  As directed   Please evaluate Glenn Harrell for admission to [facility].  Disciplines requested: Physical Therapy Services to provide: Physical Therapy Physician to follow patient's care (the person listed here will be responsible for signing ongoing orders): Knox Royalty  Requested Start of Care Date: Next Week  I certify that this patient is under my care and that I, or a Nurse Practitioner or Physician's Assistant working with me, had a face-to-face encounter that meets the physician face-to-face requirements with patient on 07/09/15. The encounter with the patient was in whole, or in part for the following medical condition(s) which is the primary reason for home health care: Hyponatremia, ataxia  Does the patient have Medicare or Medicaid?:  Yes  The encounter with the patient was in whole, or in part, for the following medical condition, which is the primary reason for home health care:  Yes  Reason for Medically Necessary  Home Health Services:  Therapy- Investment banker, operational, Patent examiner  My clinical findings support the need for the above services:  Unsafe ambulation due to balance issues  I certify that, based on my findings, the following services are medically necessary home health services:  Physical therapy  Further, I certify that my clinical findings support that this patient is homebound due to:  Unsafe ambulation due to balance issues     Call MD for:  difficulty breathing, headache or visual disturbances    Complete by:  As directed      Call MD for:  extreme fatigue    Complete by:  As directed      Call MD  for:  extreme fatigue    Complete by:  As directed      Call MD for:  hives    Complete by:  As directed      Call MD for:  persistant dizziness or light-headedness    Complete by:  As directed      Call MD for:  persistant dizziness or light-headedness    Complete by:  As directed      Call MD for:  persistant nausea and vomiting    Complete by:  As directed      Call MD for:  persistant nausea and vomiting    Complete by:  As directed      Call MD for:  redness, tenderness, or signs of infection (pain, swelling, redness, odor or green/yellow discharge around incision site)    Complete by:  As directed      Call MD for:  severe uncontrolled pain    Complete by:  As directed      Call MD for:  temperature >100.4    Complete by:  As directed      Call MD for:  temperature >100.4    Complete by:  As directed      Diet - low sodium heart healthy    Complete by:  As directed      Diet - low sodium heart healthy    Complete by:  As directed      Increase activity slowly    Complete by:  As directed      Increase activity slowly    Complete by:  As directed            Consultations:  None  Procedures Performed:  Dg Chest 2 View  07/07/2015   CLINICAL DATA:  Shortness of breath.  Pneumonia.  Initial encounter.  EXAM: CHEST  2 VIEW  COMPARISON:  None.  FINDINGS: The lungs are clear. Heart size is mildly enlarged. No pneumothorax or pleural effusion. No focal bony abnormality.  IMPRESSION: No acute disease.   Electronically Signed   By: Drusilla Kanner M.D.   On: 07/07/2015 10:31   Admission HPI: Glenn Harrell is a 79 yo man with a PMH of HTN and HLD who presents with worsening weakness. He says that he feels weak all over and it has been worsening. Moreover, he's been experience imbalance, dizziness, and ear popping. He says these symptoms started after went to the ED last week for sinusitis and have been worsening. For that ED visit, he was given a 20 day course of clarithromycin and a 1  week course of prednisone. He also endorses post-nasal drip that is associated with some "acid reflux." Because of this reflux he's been drinking approximately 32 oz of water daily. He denies any recent head injuries,  falls, headache, fever, nausea, vomiting, diarrhea. He denies any strenuous work outside. He does not smoke, drink, or use drugs.  Of note, he was noted to have hematuria in the past and a cystoscopy was performed 2 weeks ago. No source of bleeding was identified and it appears he was not given antibiotic prophylaxis. He has endorsed a degree of pain on urination over the past week.  In the ED, he was noted to have a Na to 116. A UA showed +nitrites,+leuks and he was given IV ceftriaxone.  Hospital Course by problem list: Principal Problem:   Hyponatremia Active Problems:   AKI (acute kidney injury)   Essential hypertension   Hyperlipidemia   UTI (urinary tract infection)   Hyponatremia: Patient was euvolemic on exam with unclear etiology for hyponatremia. His triamterene-HCTZ was stopped and possibly contributed to hyponatremia but unlikely to cause hyponatremia to 116. He was started on NS at 100 mL/hr to raise his Na less than 6 mEq/day with BMP q4hr. Urine osmolality was low at 246 and a serum osmolality of 248 and a urine Na of 35. TSH wnl. AM Cortisol wnl. 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 CXR r/o infection or malignancy. No other signs or symptoms of malignancy (h/o of hematuria with recent cystoscopy normal). His Na improved to 124, up 8 mEq in 13 hours and fluids were stopped and he was held on fluid restriction of <800 mL/day. Na continued to improve on volume restriction. Weakness improved. Na was 127 at time of discharge to follow up with PCP on 9/6.   Urinary Tract Infection: Likely due to recent cystoscopy. He was started on Ceftriaxone 1g IV BID. Transitioned to Cirpo 500 mg BID per sensitivites. Discharged on Cipro for 10 day total course.   HTN:  Lisinopril-HCTZ were stopped in the setting of hyponatremia and normotension. Amlodipine 10 mg daily was continued. His pressure remained normotensive throughout his hospital course. Discharged on Amlodipine 10 mg with lisinopril-HCTZ stopped.   Denture-Associated Stomatitis: Started on Nystatin mouth wash.   Sinusitis: Toward the end of prednisone regimen, but about a week of chloramphenicol left. Hld both. Continued Fluticasone spray daily.   GERD: Likely due to post-nasal drip. Famotidine 20 mg BID. He developed worse ning symptoms and had an episode of vomiting. He was given a trial of sucralfate and started on Protonix 40 mg qhs. Discharged on Protonix.   HLD: Continued home pravastatin 40 mg daily and ASA 81 mg daily.  Discharge Vitals:   BP 119/73 mmHg  Pulse 83  Temp(Src) 98.6 F (37 C) (Oral)  Resp 18  Ht  (1.88 m)  Wt 203 lb (92.08 kg)  BMI 26.05 kg/m2  SpO2 100%  Discharge Labs:  No results found for this or any previous visit (from the past 24 hour(s)).  Signed: Valentino Nose, MD 07/13/2015, 5:41 PM

## 2015-07-18 ENCOUNTER — Telehealth: Payer: Self-pay | Admitting: *Deleted

## 2015-07-18 NOTE — Telephone Encounter (Signed)
Dr Drucilla Chalet - PCP called to get discharge summary and lab studies done 07/10/15 and 07/09/15. Dr Yetta Barre has seen pt and checked labs. Stanton Kidney Kynzleigh Bandel RN 07/18/15 10:40AM

## 2018-10-16 ENCOUNTER — Encounter: Payer: Self-pay | Admitting: Cardiovascular Disease

## 2019-12-12 NOTE — Telephone Encounter (Signed)
This encounter was created in error - please disregard.

## 2021-08-05 ENCOUNTER — Emergency Department (HOSPITAL_COMMUNITY): Payer: Medicare Other

## 2021-08-05 ENCOUNTER — Emergency Department (HOSPITAL_COMMUNITY)
Admission: EM | Admit: 2021-08-05 | Discharge: 2021-08-05 | Disposition: A | Discharge status: Home / Self Care | Payer: Medicare Other | Attending: Emergency Medicine | Admitting: Emergency Medicine

## 2021-08-05 ENCOUNTER — Encounter (HOSPITAL_COMMUNITY): Payer: Self-pay | Admitting: Emergency Medicine

## 2021-08-05 ENCOUNTER — Other Ambulatory Visit: Payer: Self-pay

## 2021-08-05 DIAGNOSIS — S0990XA Unspecified injury of head, initial encounter: Secondary | ICD-10-CM | POA: Insufficient documentation

## 2021-08-05 DIAGNOSIS — S0993XA Unspecified injury of face, initial encounter: Secondary | ICD-10-CM

## 2021-08-05 DIAGNOSIS — Y92239 Unspecified place in hospital as the place of occurrence of the external cause: Secondary | ICD-10-CM | POA: Diagnosis not present

## 2021-08-05 DIAGNOSIS — M25551 Pain in right hip: Secondary | ICD-10-CM | POA: Insufficient documentation

## 2021-08-05 DIAGNOSIS — Z87891 Personal history of nicotine dependence: Secondary | ICD-10-CM | POA: Insufficient documentation

## 2021-08-05 DIAGNOSIS — I10 Essential (primary) hypertension: Secondary | ICD-10-CM | POA: Diagnosis not present

## 2021-08-05 DIAGNOSIS — Z7982 Long term (current) use of aspirin: Secondary | ICD-10-CM | POA: Diagnosis not present

## 2021-08-05 DIAGNOSIS — Z79899 Other long term (current) drug therapy: Secondary | ICD-10-CM | POA: Diagnosis not present

## 2021-08-05 DIAGNOSIS — W01198A Fall on same level from slipping, tripping and stumbling with subsequent striking against other object, initial encounter: Secondary | ICD-10-CM | POA: Diagnosis not present

## 2021-08-05 DIAGNOSIS — S01512A Laceration without foreign body of oral cavity, initial encounter: Secondary | ICD-10-CM | POA: Insufficient documentation

## 2021-08-05 DIAGNOSIS — W19XXXA Unspecified fall, initial encounter: Secondary | ICD-10-CM

## 2021-08-05 LAB — BASIC METABOLIC PANEL
Anion gap: 7 (ref 5–15)
BUN: 11 mg/dL (ref 8–23)
CO2: 28 mmol/L (ref 22–32)
Calcium: 9.3 mg/dL (ref 8.9–10.3)
Chloride: 101 mmol/L (ref 98–111)
Creatinine, Ser: 1.06 mg/dL (ref 0.61–1.24)
GFR, Estimated: >60 mL/min (ref >60)
Glucose, Bld: 88 mg/dL (ref 70–99)
Potassium: 3.9 mmol/L (ref 3.5–5.1)
Sodium: 136 mmol/L (ref 135–145)

## 2021-08-05 LAB — CBC WITH DIFFERENTIAL/PLATELET
Abs Immature Granulocytes: 0.01 10*3/uL (ref 0.00–0.07)
Basophils Absolute: 0 10*3/uL (ref 0.0–0.1)
Basophils Relative: 0 %
Eosinophils Absolute: 0.1 10*3/uL (ref 0.0–0.5)
Eosinophils Relative: 1 %
HCT: 46.7 % (ref 39.0–52.0)
Hemoglobin: 15.3 g/dL (ref 13.0–17.0)
Immature Granulocytes: 0 %
Lymphocytes Relative: 15 %
Lymphs Abs: 1.1 10*3/uL (ref 0.7–4.0)
MCH: 31.6 pg (ref 26.0–34.0)
MCHC: 32.8 g/dL (ref 30.0–36.0)
MCV: 96.5 fL (ref 80.0–100.0)
Monocytes Absolute: 0.5 10*3/uL (ref 0.1–1.0)
Monocytes Relative: 7 %
Neutro Abs: 5.9 10*3/uL (ref 1.7–7.7)
Neutrophils Relative %: 77 %
Platelets: 248 10*3/uL (ref 150–400)
RBC: 4.84 MIL/uL (ref 4.22–5.81)
RDW: 13.2 % (ref 11.5–15.5)
WBC: 7.7 10*3/uL (ref 4.0–10.5)
nRBC: 0 % (ref 0.0–0.2)

## 2021-08-05 NOTE — Discharge Instructions (Addendum)
Follow up with your primary care doctor if you have any increasing pain.  Return to the emergency department if you are unable to walk, have any change in your mental status including severe sleepiness and unable to arouse easily, severe headache uncontrolled by Tylenol or Motrin at home or new extremity weakness.

## 2021-08-05 NOTE — ED Provider Notes (Signed)
[facility] EMERGENCY DEPARTMENT Provider Note   CSN: [id number] Arrival date & time: 08/05/21  1126     History Chief Complaint  Patient presents with   Glenn Harrell is a 85 y.o. male who suffered a mechanical fall while visiting his wife upstairs in the hospital.  Patient lost his footing, fell forward and caught himself with his right hand.  Fell onto his head, hit his lip and the right side of his head.  He denies loss of consciousness and was able to get up and walk.  He denies any significant extremity pain.  He has chronic right hip pain.  He is not on any blood thinners.  He denies numbness or tingling in his extremities.  Patient does not take blood thinners  The history is provided by the patient and a relative.  Fall This is a new problem. The current episode started less than 1 hour ago. The problem occurs rarely. The problem has not changed since onset.Pertinent negatives include no chest pain, no abdominal pain, no headaches and no shortness of breath.      Past Medical History:  Diagnosis Date   Arthritis    "knees" (07/09/2015)   Blood in urine 06/2015   GERD (gastroesophageal reflux disease)    Heart murmur    Hypertension     Patient Active Problem List   Diagnosis Date Noted   Hyponatremia 07/07/2015   AKI (acute kidney injury) (HCC) 07/07/2015   Essential hypertension 07/07/2015   Hyperlipidemia 07/07/2015   UTI (urinary tract infection) 07/07/2015    Past Surgical History:  Procedure Laterality Date   CYSTOSCOPY  06/2015   hematuria       History reviewed. No pertinent family history.  Social History   Tobacco Use   Smoking status: Former    Types: Cigars   Smokeless tobacco: Never   Tobacco comments:    "quit smoking in the 1970's"  Substance Use Topics   Alcohol use: No   Drug use: No    Home Medications Prior to Admission medications   Medication Sig Start Date End Date Taking? Authorizing Provider   amLODipine (NORVASC) 10 MG tablet Take 10 mg by mouth daily. 06/13/15   [provider]  aspirin 81 MG chewable tablet Chew 81 mg by mouth daily.    [provider]  fluticasone (FLONASE) 50 MCG/ACT nasal spray Place 2 sprays into both nostrils daily. 07/09/15   Ruben Im, MD  nystatin (MYCOSTATIN) 100000 UNIT/ML suspension Use as directed 5 mLs (500,000 Units total) in the mouth or throat 4 (four) times daily. 07/09/15   Ruben Im, MD  pantoprazole (PROTONIX) 40 MG tablet Take 1 tablet (40 mg total) by mouth every evening. 07/09/15   Ruben Im, MD  pravastatin (PRAVACHOL) 40 MG tablet Take 40 mg by mouth at bedtime. 06/07/15   [provider]    Allergies    Patient has no known allergies.  Review of Systems   Review of Systems  Respiratory:  Negative for shortness of breath.   Cardiovascular:  Negative for chest pain.  Gastrointestinal:  Negative for abdominal pain.  Neurological:  Negative for headaches.   Physical Exam Updated Vital Signs BP 139/83 (BP Location: Left Arm)   Pulse 71   Temp 97.6 F (36.4 C) (Oral)   Resp 18   SpO2 100%   Physical Exam Vitals and nursing note reviewed.  Constitutional:      General: He is not in  acute distress.    Appearance: He is well-developed. He is not diaphoretic.  HENT:     Head: Normocephalic.     Comments: Swelling to the upper lip, small laceration from the teeth on the coastal surface.  Teeth are in good alignment without subluxation, no malocclusion No scalp hematomas or facial injuries noted  Eyes:     General: No scleral icterus.    Conjunctiva/sclera: Conjunctivae normal.  Cardiovascular:     Rate and Rhythm: Normal rate and regular rhythm.     Heart sounds: Normal heart sounds.  Pulmonary:     Effort: Pulmonary effort is normal. No respiratory distress.     Breath sounds: Normal breath sounds.  Abdominal:     Palpations: Abdomen is soft.     Tenderness: There is no abdominal tenderness.   Musculoskeletal:     Cervical back: Normal range of motion and neck supple.  Skin:    General: Skin is warm and dry.  Neurological:     General: No focal deficit present.     Mental Status: He is alert and oriented to person, place, and time.  Psychiatric:        Behavior: Behavior normal.    ED Results / Procedures / Treatments   Labs (all labs ordered are listed, but only abnormal results are displayed) Labs Reviewed  CBC WITH DIFFERENTIAL/PLATELET  BASIC METABOLIC PANEL    EKG None  Radiology No results found.  Procedures Procedures   Medications Ordered in ED Medications - No data to display  ED Course  I have reviewed the triage vital signs and the nursing notes.  Pertinent labs & imaging results that were available during my care of the patient were reviewed by me and considered in my medical decision making (see chart for details).    MDM Rules/Calculators/A&P                           Is an 85 year old male who suffered a mechanical fall on the floors.  I reviewed labs and imaging ordered in triage.  Labs included CBC and BMP without any abnormality.  I reviewed CT images of the head and C-spine as well as plain film of the right hip and right wrist.  There are no acute findings on these images.  Patient is ambulatory here in the emergency department.  No signs of serious head injury or fracture.discussed home care and op f/u as well as return precautions. Final Clinical Impression(s) / ED Diagnoses Final diagnoses:  None    Rx / DC Orders ED Discharge Orders     None        Arthor Captain, PA-C 08/05/21 1731    Tegeler, Canary Brim, MD 08/06/21 [phone]

## 2021-08-05 NOTE — ED Triage Notes (Signed)
Pt here for a fall. Pt and daughter were visiting pt in hospital, pt ambulates w/ cane and was c/o weakness in R leg and pain in R hip, pt states he has chronic R hip pain. Pt fell onto R side, broke fall w/ R forearm, hit R side of head on ground. Pt had swelling to upper lip, Aox4, no unilateral weakness.

## 2021-08-05 NOTE — ED Notes (Signed)
Patient transported to CT 

## 2022-04-30 IMAGING — CT CT HEAD W/O CM
3 series · 15 of 47 positions shown, 18 images · non-contrast
Comparison: None.

CLINICAL DATA: Status post fall, right leg weakness

EXAM:
CT HEAD WITHOUT CONTRAST
CT CERVICAL SPINE WITHOUT CONTRAST
TECHNIQUE: Multidetector CT imaging of the head and cervical spine was
performed following the standard protocol without intravenous
contrast. Multiplanar CT image reconstructions of the cervical spine
were also generated.

[Series 3: head 5.0 h30s · axial · 0.44mm/px · z∈[-166,-21]mm · 9 of 35 slices shown, 12 images]
[im 3/35  brain]
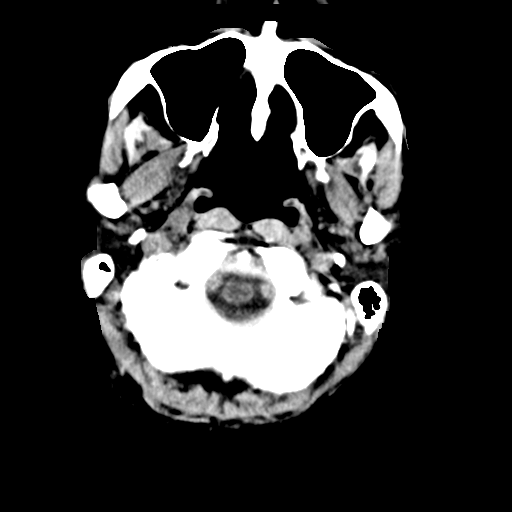
[im 3/35  bone]
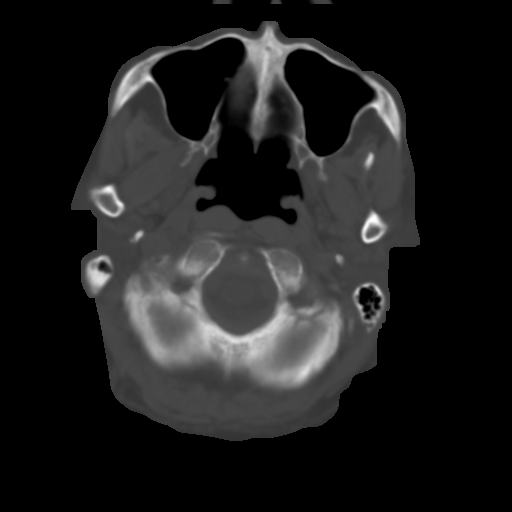
[im 6/35  brain]
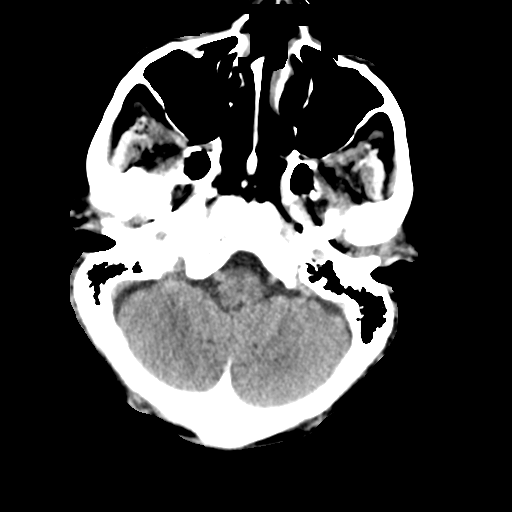
[im 10/35  brain]
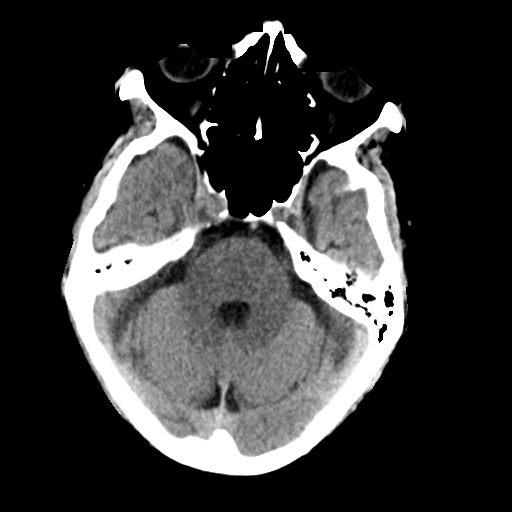
[im 13/35  brain]
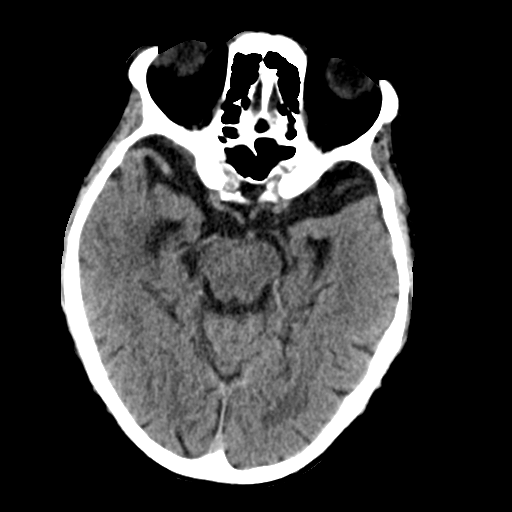
[im 18/35  brain]
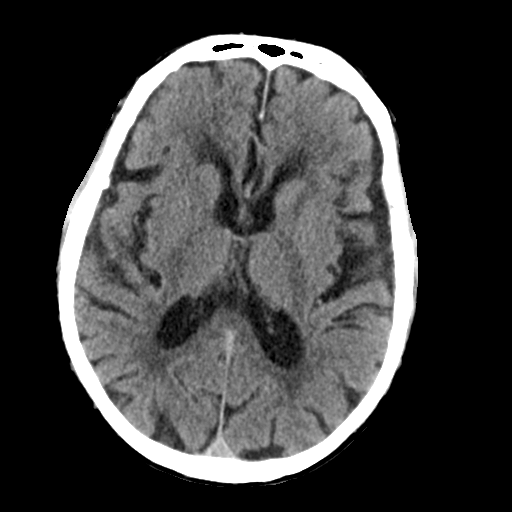
[im 18/35  bone]
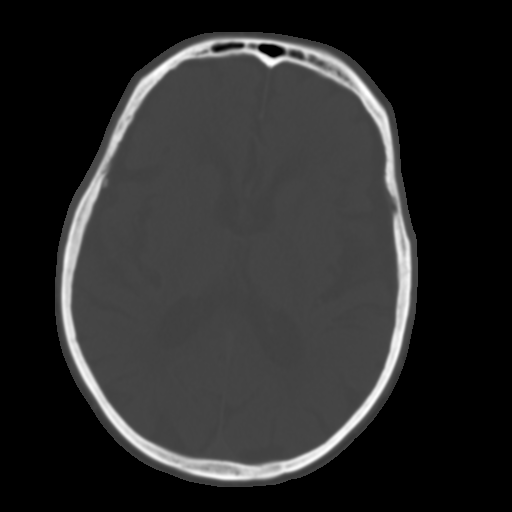
[im 22/35  brain]
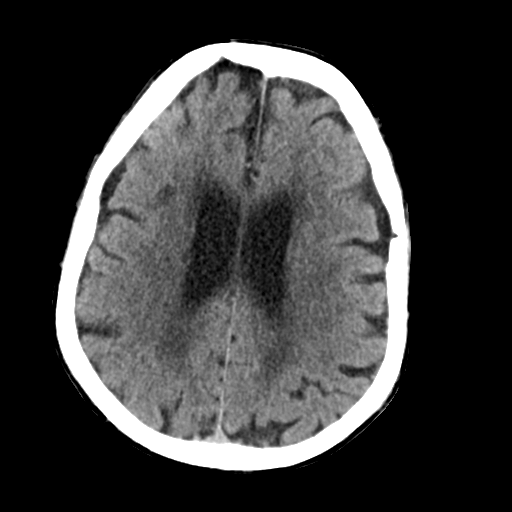
[im 25/35  brain]
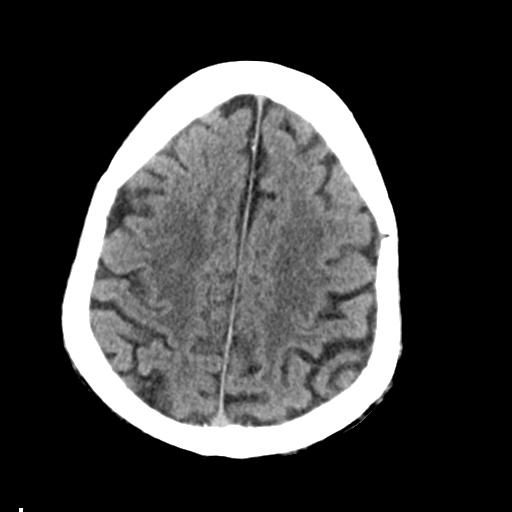
[im 29/35  brain]
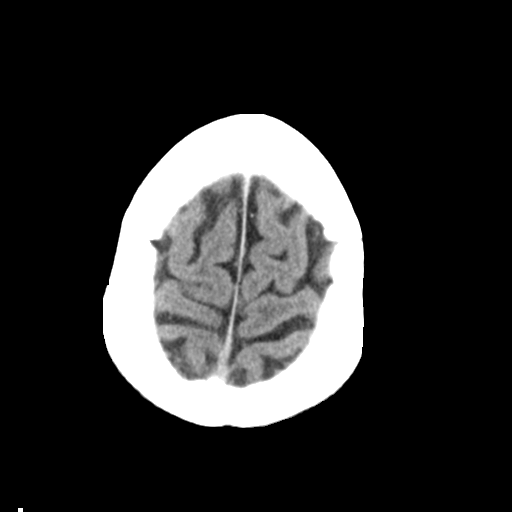
[im 32/35  brain]
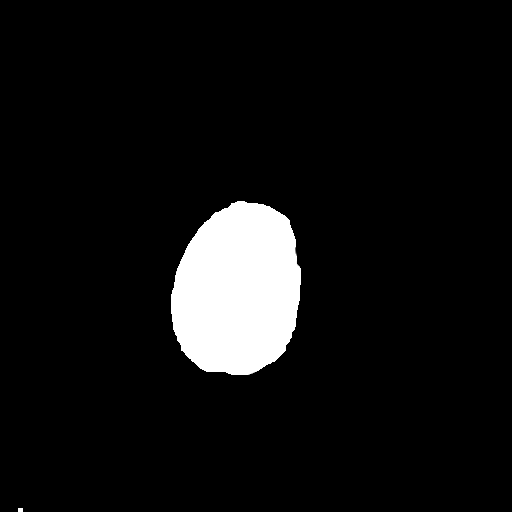
[im 32/35  bone]
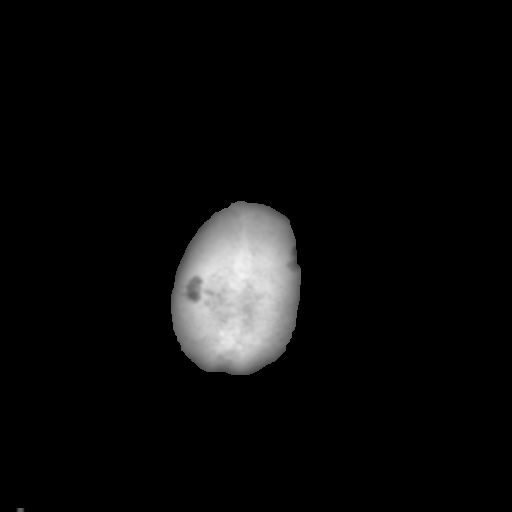

[Series 5: head 3.0 mpr cor · coronal · 0.35mm/px · 3 of 72 slices shown]
[im 24/72  brain]
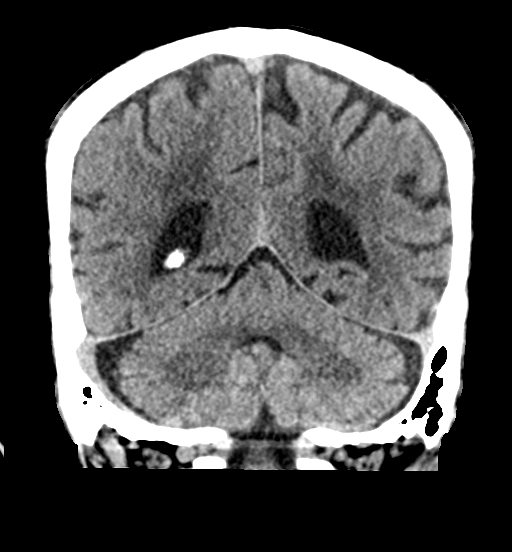
[im 32/72  brain]
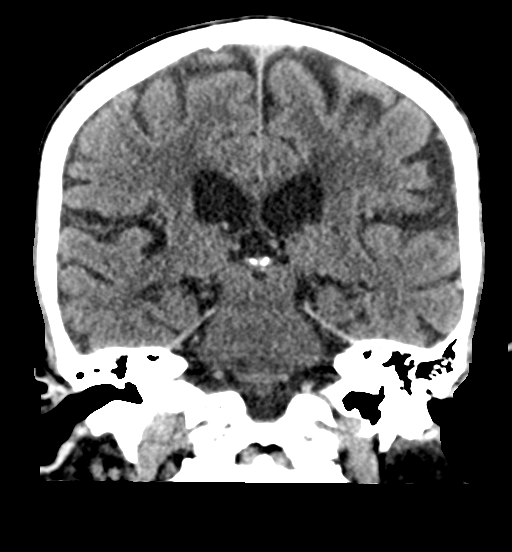
[im 40/72  brain]
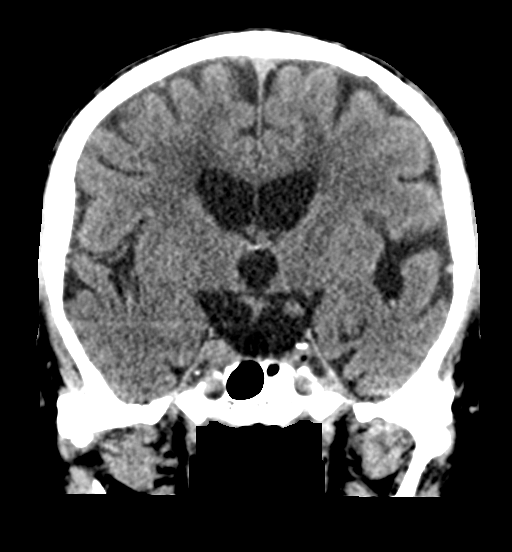

[Series 6: head 3.0 mpr sag · sagittal · 0.35mm/px · 3 of 61 slices shown]
[im 21/61  brain]
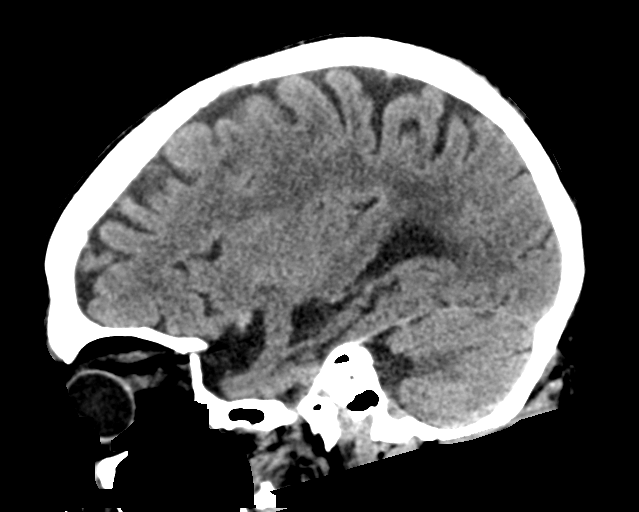
[im 31/61  brain]
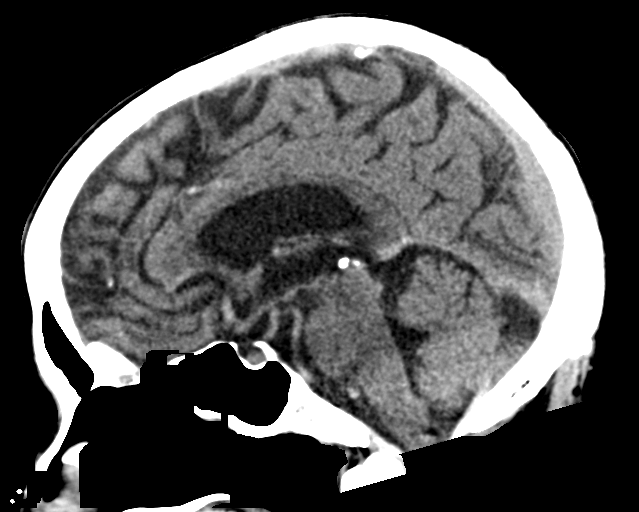
[im 41/61  brain]
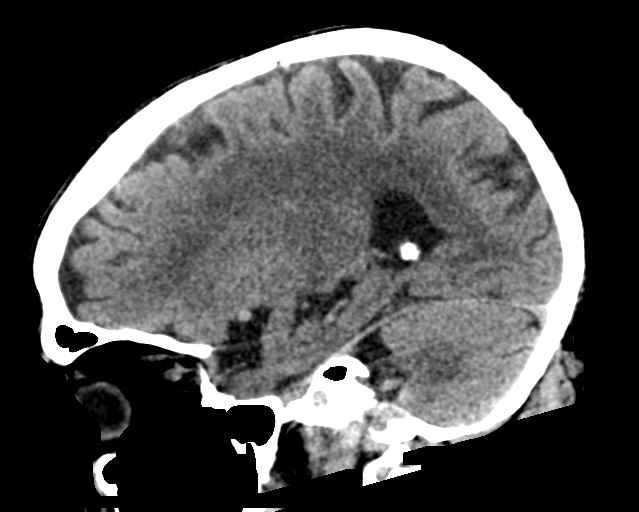

[15 of 47 positions shown; findings below may reference images not displayed]

FINDINGS: Brain: No evidence of acute infarction, hemorrhage, extra-axial
collection, ventriculomegaly, or mass effect. Right parafalcine 7 mm
parafalcine hyperdense nodule likely reflecting a small meningioma.
Generalized cerebral atrophy. Periventricular white matter low
attenuation likely secondary to microangiopathy.

Vascular: Cerebrovascular atherosclerotic calcifications are noted.
No hyperdense vessels.

Skull: Negative for fracture or focal lesion.

Sinuses/Orbits: Visualized portions of the orbits are unremarkable.
Visualized portions of the paranasal sinuses are unremarkable.
Visualized portions of the mastoid air cells are unremarkable.

Other: None.

CT CERVICAL SPINE FINDINGS

Alignment: 2 mm anterolisthesis of C4 on C5. 2 mm retrolisthesis of
C5 on C6.

Skull base and vertebrae: No acute fracture. No primary bone lesion
or focal pathologic process.

Soft tissues and spinal canal: No prevertebral fluid or swelling. No
visible canal hematoma.

Disc levels: Degenerative disease with disc height loss at C2-3,
C5-6 and C6-7. Partial ankylosis across the right and left lateral
aspects of the C2-3 vertebral bodies and posterior elements. At C3-4
there is moderate right facet arthropathy. At C4-5 there is moderate
right and mild left facet arthropathy without foraminal stenosis. At
C5-6 there is a broad-based disc bulge, bilateral uncovertebral
degenerative changes, and right foraminal stenosis. At C6-7 there is
bilateral uncovertebral degenerative change with moderate right
foraminal stenosis. At C7-T1 there is severe bilateral facet
arthropathy. At T1-2 and T2-3 there is severe bilateral facet
arthropathy.

Upper chest: Lung apices are clear.

Other: Bilateral carotid artery atherosclerosis.
IMPRESSION: 1. No acute intracranial pathology.
2.  No acute osseous injury of the cervical spine.
3. Cervical spine spondylosis as described above.

## 2023-03-08 ENCOUNTER — Ambulatory Visit (INDEPENDENT_AMBULATORY_CARE_PROVIDER_SITE_OTHER): Payer: Medicare Other | Admitting: Podiatry

## 2023-03-08 ENCOUNTER — Encounter: Payer: Self-pay | Admitting: Podiatry

## 2023-03-08 DIAGNOSIS — B351 Tinea unguium: Secondary | ICD-10-CM

## 2023-03-08 DIAGNOSIS — M79674 Pain in right toe(s): Secondary | ICD-10-CM | POA: Diagnosis not present

## 2023-03-08 DIAGNOSIS — M79675 Pain in left toe(s): Secondary | ICD-10-CM

## 2023-03-08 NOTE — Progress Notes (Signed)
  Subjective:  Patient ID: Glenn Harrell, male    DOB: 03-Apr-1936,   MRN: [id number]  Chief Complaint  Patient presents with   routine foot care     87 y.o. male presents for concern of thickened elongated and painful nails that are difficult to trim. Requesting to have them trimmed today.   PCP:  Knox Royalty, MD    . Denies any other pedal complaints. Denies n/v/f/c.   Past Medical History:  Diagnosis Date   Arthritis    "knees" (07/09/2015)   Blood in urine 06/2015   GERD (gastroesophageal reflux disease)    Heart murmur    Hypertension     Objective:  Physical Exam: Vascular: DP/PT pulses 2/4 bilateral. CFT <3 seconds. Normal hair growth on digits. No edema.  Skin. No lacerations or abrasions bilateral feet. Nails 1-5 bilateral are thickened elongated and discolored with subungual debris.  Musculoskeletal: MMT 5/5 bilateral lower extremities in DF, PF, Inversion and Eversion. Deceased ROM in DF of ankle joint.  Neurological: Sensation intact to light touch.   Assessment:   1. Pain due to onychomycosis of toenails of both feet      Plan:  Patient was evaluated and treated and all questions answered. -Mechanically debrided all nails 1-5 bilateral using sterile nail nipper and filed with dremel without incident  -Answered all patient questions -Patient to return  in 3 months for at risk foot care -Patient advised to call the office if any problems or questions arise in the meantime.   Louann Sjogren, DPM

## 2023-06-07 ENCOUNTER — Encounter: Payer: Self-pay | Admitting: Podiatry

## 2023-06-07 ENCOUNTER — Ambulatory Visit (INDEPENDENT_AMBULATORY_CARE_PROVIDER_SITE_OTHER): Payer: Medicare Other | Admitting: Podiatry

## 2023-06-07 DIAGNOSIS — M79674 Pain in right toe(s): Secondary | ICD-10-CM | POA: Diagnosis not present

## 2023-06-07 DIAGNOSIS — B351 Tinea unguium: Secondary | ICD-10-CM | POA: Diagnosis not present

## 2023-06-07 DIAGNOSIS — M79675 Pain in left toe(s): Secondary | ICD-10-CM | POA: Diagnosis not present

## 2023-06-07 NOTE — Progress Notes (Signed)
  Subjective:  Patient ID: Glenn Harrell, male    DOB: [birth year],   MRN: [id number]  Chief Complaint  Patient presents with   Nail Problem    RFC    87 y.o. male presents for concern of thickened elongated and painful nails that are difficult to trim. Requesting to have them trimmed today.   PCP:  Knox Royalty, MD    . Denies any other pedal complaints. Denies n/v/f/c.   Past Medical History:  Diagnosis Date   Arthritis    "knees" (07/09/2015)   Blood in urine 06/2015   GERD (gastroesophageal reflux disease)    Heart murmur    Hypertension     Objective:  Physical Exam: Vascular: DP/PT pulses 2/4 bilateral. CFT <3 seconds. Normal hair growth on digits. No edema.  Skin. No lacerations or abrasions bilateral feet. Nails 1-5 bilateral are thickened elongated and discolored with subungual debris.  Musculoskeletal: MMT 5/5 bilateral lower extremities in DF, PF, Inversion and Eversion. Deceased ROM in DF of ankle joint.  Neurological: Sensation intact to light touch.   Assessment:   1. Pain due to onychomycosis of toenails of both feet      Plan:  Patient was evaluated and treated and all questions answered. -Mechanically debrided all nails 1-5 bilateral using sterile nail nipper and filed with dremel without incident  -Answered all patient questions -Patient to return  in 3 months for at risk foot care -Patient advised to call the office if any problems or questions arise in the meantime.   Louann Sjogren, DPM

## 2023-08-05 ENCOUNTER — Other Ambulatory Visit: Payer: Self-pay | Admitting: Urology

## 2023-08-05 DIAGNOSIS — R972 Elevated prostate specific antigen [PSA]: Secondary | ICD-10-CM

## 2023-08-30 ENCOUNTER — Ambulatory Visit
Admission: RE | Admit: 2023-08-30 | Discharge: 2023-08-30 | Disposition: A | Discharge status: Home / Self Care | Payer: Medicare Other | Source: Ambulatory Visit | Attending: Urology

## 2023-08-30 DIAGNOSIS — R972 Elevated prostate specific antigen [PSA]: Secondary | ICD-10-CM

## 2023-08-30 MED ORDER — GADOPICLENOL 0.5 MMOL/ML IV SOLN
7.5000 mL | Freq: Once | INTRAVENOUS | Status: AC | PRN
  Administered 2023-08-30: 7 mL via INTRAVENOUS

## 2023-09-15 ENCOUNTER — Encounter: Payer: Self-pay | Admitting: Podiatry

## 2023-09-15 ENCOUNTER — Ambulatory Visit (INDEPENDENT_AMBULATORY_CARE_PROVIDER_SITE_OTHER): Payer: Medicare Other | Admitting: Podiatry

## 2023-09-15 VITALS — Ht 74.0 in | Wt 145.0 lb

## 2023-09-15 DIAGNOSIS — B351 Tinea unguium: Secondary | ICD-10-CM | POA: Diagnosis not present

## 2023-09-15 DIAGNOSIS — M79675 Pain in left toe(s): Secondary | ICD-10-CM

## 2023-09-15 DIAGNOSIS — M79674 Pain in right toe(s): Secondary | ICD-10-CM

## 2023-09-15 NOTE — Progress Notes (Signed)
Subjective:   Patient ID: Glenn Harrell, male   DOB: 87 y.o.   MRN: [id number]   HPI Patient presents with thick yellow brittle nailbeds 1-5 both feet that are painful and he cannot cut   ROS      Objective:  Physical Exam  Neurovascular status intact with thick yellow brittle nailbeds 1-5 both feet     Assessment:  Mycotic nail infection with pain 1-5 both feet     Plan:  Debridement nailbeds 1-5 both feet no intra genic bleeding reappoint routine care

## 2023-12-16 ENCOUNTER — Ambulatory Visit: Payer: Medicare PPO | Admitting: Podiatry

## 2023-12-16 ENCOUNTER — Encounter: Payer: Self-pay | Admitting: Podiatry

## 2023-12-16 DIAGNOSIS — M79675 Pain in left toe(s): Secondary | ICD-10-CM | POA: Diagnosis not present

## 2023-12-16 DIAGNOSIS — B351 Tinea unguium: Secondary | ICD-10-CM | POA: Diagnosis not present

## 2023-12-16 DIAGNOSIS — M79674 Pain in right toe(s): Secondary | ICD-10-CM | POA: Diagnosis not present

## 2023-12-16 NOTE — Progress Notes (Signed)
 Subjective:   Patient ID: Glenn Harrell, male   DOB: 88 y.o.   MRN: [id number]   HPI Patient presents with painful nailbeds 1-5 both feet that she cannot take care of   ROS      Objective:  Physical Exam  Neuro vascular status probably o'clock thick yellow brittle nailbeds 1-5 both feet painful     Assessment:  Chronic mycotic nail infection 1-5 both feet with pain     Plan:  Debridement nailbeds 1-5 both feet no iatrogenic bleeding reappoint routine care

## 2024-03-19 ENCOUNTER — Ambulatory Visit (INDEPENDENT_AMBULATORY_CARE_PROVIDER_SITE_OTHER): Payer: Medicare PPO | Admitting: Podiatry

## 2024-03-19 DIAGNOSIS — Z91199 Patient's noncompliance with other medical treatment and regimen due to unspecified reason: Secondary | ICD-10-CM

## 2024-03-19 NOTE — Progress Notes (Signed)
 Cancel 24 hours

## 2024-04-17 ENCOUNTER — Ambulatory Visit: Admitting: Podiatry

## 2024-04-17 ENCOUNTER — Encounter: Payer: Self-pay | Admitting: Podiatry

## 2024-04-17 DIAGNOSIS — B351 Tinea unguium: Secondary | ICD-10-CM

## 2024-04-17 DIAGNOSIS — M79675 Pain in left toe(s): Secondary | ICD-10-CM

## 2024-04-17 DIAGNOSIS — M79674 Pain in right toe(s): Secondary | ICD-10-CM | POA: Diagnosis not present

## 2024-04-17 NOTE — Progress Notes (Signed)
  Subjective:  Patient ID: Glenn Harrell, male    DOB: 24-Apr-1936,   MRN: [id number]  No chief complaint on file.   88 y.o. male presents for concern of thickened elongated and painful nails that are difficult to trim. Requesting to have them trimmed today.   PCP:  Trellis Fries, MD    . Denies any other pedal complaints. Denies n/v/f/c.   Past Medical History:  Diagnosis Date   Arthritis    "knees" (07/09/2015)   Blood in urine 06/2015   GERD (gastroesophageal reflux disease)    Heart murmur    Hypertension     Objective:  Physical Exam: Vascular: DP/PT pulses 2/4 bilateral. CFT <3 seconds. Normal hair growth on digits. No edema.  Skin. No lacerations or abrasions bilateral feet. Nails 1-5 bilateral are thickened elongated and discolored with subungual debris.  Musculoskeletal: MMT 5/5 bilateral lower extremities in DF, PF, Inversion and Eversion. Deceased ROM in DF of ankle joint.  Neurological: Sensation intact to light touch.   Assessment:   1. Pain due to onychomycosis of toenails of both feet       Plan:  Patient was evaluated and treated and all questions answered. -Mechanically debrided all nails 1-5 bilateral using sterile nail nipper and filed with dremel without incident  -Answered all patient questions -Patient to return  in 3 months for at risk foot care -Patient advised to call the office if any problems or questions arise in the meantime.   Jennefer Moats, DPM

## 2024-07-25 ENCOUNTER — Ambulatory Visit (INDEPENDENT_AMBULATORY_CARE_PROVIDER_SITE_OTHER): Payer: Medicare Other | Admitting: Otolaryngology

## 2024-08-01 ENCOUNTER — Encounter: Payer: Self-pay | Admitting: Podiatry

## 2024-08-01 ENCOUNTER — Ambulatory Visit: Admitting: Podiatry

## 2024-08-01 VITALS — Ht 74.0 in | Wt 145.0 lb

## 2024-08-01 DIAGNOSIS — M79675 Pain in left toe(s): Secondary | ICD-10-CM

## 2024-08-01 DIAGNOSIS — M79674 Pain in right toe(s): Secondary | ICD-10-CM

## 2024-08-01 DIAGNOSIS — B351 Tinea unguium: Secondary | ICD-10-CM | POA: Diagnosis not present

## 2024-08-01 NOTE — Progress Notes (Signed)
  Subjective:  Patient ID: Glenn Harrell, male    DOB: 1936/04/25,  MRN: [id number]  Glenn Harrell presents to clinic today for: painful mycotic toenails of both feet that are difficult to trim. Pain interferes with daily activities and wearing enclosed shoe gear comfortably.  Chief Complaint  Patient presents with   Nail Problem    Rm 16 RFC. Pt is not diabetic. PCP- Dr. Tobias Sharps, last visit March 2025.    No Known Allergies  Review of Systems: Negative except as noted in the HPI.  Objective: No changes noted in today's physical examination. There were no vitals filed for this visit.  Glenn Harrell is a pleasant 88 y.o. male in NAD. AAO x 3.  Vascular Examination: Capillary refill time <3 seconds b/l LE. Palpable pedal pulses b/l LE. Digital hair present b/l. Trace pedal edema b/l. Skin temperature gradient WNL b/l. No varicosities b/l. No cyanosis or clubbing. No ischemia or gangrene. .  Dermatological Examination: Pedal skin with normal turgor, texture and tone b/l. No open wounds. No interdigital macerations b/l. Toenails 1-5 b/l thickened, discolored, dystrophic with subungual debris. There is pain on palpation to dorsal aspect of nailplates. No hyperkeratotic nor porokeratotic lesions.   Neurological Examination: Protective sensation intact with 10 gram monofilament b/l LE.   Musculoskeletal Examination: Muscle strength 5/5 to all lower extremity muscle groups bilaterally. No pain, crepitus or joint limitation noted with ROM bilateral LE. No gross bony deformities bilaterally.  Assessment/Plan: 1. Pain due to onychomycosis of toenails of both feet    -Patient was evaluated today. All questions/concerns addressed on today's visit. -Patient's family member present. All questions/concerns addressed on today's visit. -Patient to continue soft, supportive shoe gear daily. -Toenails 1-5 b/l were debrided in length and girth with sterile nail nippers and dremel without iatrogenic  bleeding.  -Patient/POA to call should there be question/concern in the interim.   Return in about 3 months (around 10/31/2024).  Glenn Harrell, DPM      [facility] LOCATION: [address], [place] [address]                   Office [phone]   [facility] LOCATION: [address] [place], [place] [address] Office [phone]

## 2024-09-06 DIAGNOSIS — M1611 Unilateral primary osteoarthritis, right hip: Secondary | ICD-10-CM | POA: Insufficient documentation

## 2024-09-11 DIAGNOSIS — H25812 Combined forms of age-related cataract, left eye: Secondary | ICD-10-CM | POA: Insufficient documentation

## 2024-11-21 ENCOUNTER — Encounter: Payer: Self-pay | Admitting: Podiatry

## 2024-11-21 ENCOUNTER — Ambulatory Visit: Admitting: Podiatry

## 2024-11-21 DIAGNOSIS — B351 Tinea unguium: Secondary | ICD-10-CM

## 2024-11-21 DIAGNOSIS — M79674 Pain in right toe(s): Secondary | ICD-10-CM | POA: Diagnosis not present

## 2024-11-21 DIAGNOSIS — M79675 Pain in left toe(s): Secondary | ICD-10-CM

## 2024-11-28 NOTE — Progress Notes (Signed)
"  °  Subjective:  Patient ID: Glenn Harrell, male    DOB: [birth year],  MRN: [id number]  Glenn Harrell presents to clinic today for painful thick toenails that are difficult to trim. Pain interferes with ambulation. Aggravating factors include wearing enclosed shoe gear. Pain is relieved with periodic professional debridement.  Chief Complaint  Patient presents with   RFC    RFC. Not diabetic. Sharps Round, MD PCP 11/21/23    New problem(s): None.   PCP is Sharps Round, MD.  Allergies[1]  Review of Systems: Negative except as noted in the HPI.  Objective: No changes noted in today's physical examination. There were no vitals filed for this visit. Glenn Harrell is a pleasant 89 y.o. male in NAD. AAO x 3.  Vascular Examination: Capillary refill time <3 seconds b/l LE. Palpable pedal pulses b/l LE. Digital hair present b/l. Trace pedal edema b/l. Skin temperature gradient WNL b/l. No varicosities b/l. No cyanosis or clubbing. No ischemia or gangrene. .  Dermatological Examination: Pedal skin with normal turgor, texture and tone b/l. No open wounds. No interdigital macerations b/l. Toenails 1-5 b/l thickened, discolored, dystrophic with subungual debris. There is pain on palpation to dorsal aspect of nailplates. No hyperkeratotic nor porokeratotic lesions.   Neurological Examination: Protective sensation intact with 10 gram monofilament b/l LE.   Musculoskeletal Examination: Muscle strength 5/5 to all lower extremity muscle groups bilaterally. No pain, crepitus or joint limitation noted with ROM bilateral LE. No gross bony deformities bilaterally.  Assessment/Plan: 1. Pain due to onychomycosis of toenails of both feet   Consent given for treatment. Patient examined. All patient's and/or POA's questions/concerns addressed on today's visit. Mycotic toenails 1-5 b/l debrided in length and girth without incident. Continue soft, supportive shoe gear daily. Report any pedal injuries to medical  professional. Call office if there are any quesitons/concerns. -Patient/POA to call should there be question/concern in the interim.   Return in about 3 months (around 02/19/2025).  Glenn Harrell, DPM      [facility] LOCATION: [address], [place] [address]                   Office [phone]   [facility] LOCATION: [address] [place], [place] [address] Office [phone]     [1] No Known Allergies  "

## 2025-02-27 ENCOUNTER — Ambulatory Visit: Admitting: Podiatry
